# Patient Record
Sex: Male | Born: 1935 | Race: White | Hispanic: No | Marital: Married | Smoking: Former smoker
Health system: Southern US, Community
[De-identification: ages and names within clinical notes are randomized; demographics above are authoritative.]

## PROBLEM LIST (undated history)

## (undated) DIAGNOSIS — Z87898 Personal history of other specified conditions: Secondary | ICD-10-CM

## (undated) DIAGNOSIS — I6529 Occlusion and stenosis of unspecified carotid artery: Secondary | ICD-10-CM

## (undated) DIAGNOSIS — I714 Abdominal aortic aneurysm, without rupture, unspecified: Secondary | ICD-10-CM

## (undated) DIAGNOSIS — E785 Hyperlipidemia, unspecified: Secondary | ICD-10-CM

## (undated) DIAGNOSIS — Z87442 Personal history of urinary calculi: Secondary | ICD-10-CM

## (undated) DIAGNOSIS — E119 Type 2 diabetes mellitus without complications: Secondary | ICD-10-CM

## (undated) DIAGNOSIS — I2510 Atherosclerotic heart disease of native coronary artery without angina pectoris: Secondary | ICD-10-CM

## (undated) DIAGNOSIS — I10 Essential (primary) hypertension: Secondary | ICD-10-CM

## (undated) DIAGNOSIS — R251 Tremor, unspecified: Secondary | ICD-10-CM

## (undated) DIAGNOSIS — I739 Peripheral vascular disease, unspecified: Secondary | ICD-10-CM

## (undated) DIAGNOSIS — R011 Cardiac murmur, unspecified: Secondary | ICD-10-CM

## (undated) DIAGNOSIS — C4491 Basal cell carcinoma of skin, unspecified: Secondary | ICD-10-CM

## (undated) DIAGNOSIS — Z9289 Personal history of other medical treatment: Secondary | ICD-10-CM

## (undated) DIAGNOSIS — D649 Anemia, unspecified: Secondary | ICD-10-CM

## (undated) DIAGNOSIS — M069 Rheumatoid arthritis, unspecified: Secondary | ICD-10-CM

## (undated) DIAGNOSIS — R569 Unspecified convulsions: Secondary | ICD-10-CM

## (undated) DIAGNOSIS — K259 Gastric ulcer, unspecified as acute or chronic, without hemorrhage or perforation: Secondary | ICD-10-CM

## (undated) DIAGNOSIS — M1990 Unspecified osteoarthritis, unspecified site: Secondary | ICD-10-CM

## (undated) DIAGNOSIS — IMO0001 Reserved for inherently not codable concepts without codable children: Secondary | ICD-10-CM

## (undated) DIAGNOSIS — I38 Endocarditis, valve unspecified: Secondary | ICD-10-CM

## (undated) DIAGNOSIS — F1021 Alcohol dependence, in remission: Secondary | ICD-10-CM

## (undated) DIAGNOSIS — H269 Unspecified cataract: Secondary | ICD-10-CM

## (undated) HISTORY — PX: FEMORAL BYPASS: SHX50

## (undated) HISTORY — DX: Anemia, unspecified: D64.9

## (undated) HISTORY — DX: Peripheral vascular disease, unspecified: I73.9

## (undated) HISTORY — DX: Essential (primary) hypertension: I10

## (undated) HISTORY — DX: Type 2 diabetes mellitus without complications: E11.9

## (undated) HISTORY — PX: ABDOMINAL AORTIC ANEURYSM REPAIR: SUR1152

## (undated) HISTORY — DX: Abdominal aortic aneurysm, without rupture: I71.4

## (undated) HISTORY — DX: Alcohol dependence, in remission: F10.21

## (undated) HISTORY — DX: Occlusion and stenosis of unspecified carotid artery: I65.29

## (undated) HISTORY — DX: Basal cell carcinoma of skin, unspecified: C44.91

## (undated) HISTORY — DX: Rheumatoid arthritis, unspecified: M06.9

## (undated) HISTORY — DX: Cardiac murmur, unspecified: R01.1

## (undated) HISTORY — PX: COLONOSCOPY: SHX174

## (undated) HISTORY — PX: CYSTOSCOPY: SUR368

## (undated) HISTORY — DX: Abdominal aortic aneurysm, without rupture, unspecified: I71.40

## (undated) HISTORY — DX: Tremor, unspecified: R25.1

## (undated) HISTORY — DX: Endocarditis, valve unspecified: I38

## (undated) HISTORY — DX: Hyperlipidemia, unspecified: E78.5

## (undated) HISTORY — DX: Unspecified osteoarthritis, unspecified site: M19.90

## (undated) HISTORY — DX: Unspecified cataract: H26.9

## (undated) HISTORY — DX: Personal history of other specified conditions: Z87.898

## (undated) HISTORY — PX: CATARACT EXTRACTION, BILATERAL: SHX1313

---

## 1994-05-16 HISTORY — PX: HERNIA REPAIR: SHX51

## 2004-07-08 ENCOUNTER — Ambulatory Visit: Payer: Self-pay | Admitting: General Surgery

## 2005-03-23 ENCOUNTER — Ambulatory Visit: Payer: Self-pay | Admitting: Rheumatology

## 2006-06-21 ENCOUNTER — Inpatient Hospital Stay: Payer: Self-pay | Admitting: Internal Medicine

## 2006-06-21 ENCOUNTER — Other Ambulatory Visit: Payer: Self-pay

## 2006-10-06 ENCOUNTER — Ambulatory Visit: Payer: Self-pay | Admitting: Gastroenterology

## 2007-01-25 ENCOUNTER — Ambulatory Visit: Payer: Self-pay | Admitting: Rheumatology

## 2007-02-05 ENCOUNTER — Encounter: Payer: Self-pay | Admitting: Rheumatology

## 2007-02-14 ENCOUNTER — Encounter: Payer: Self-pay | Admitting: Rheumatology

## 2007-03-17 ENCOUNTER — Encounter: Payer: Self-pay | Admitting: Rheumatology

## 2007-08-07 ENCOUNTER — Ambulatory Visit: Payer: Self-pay | Admitting: Vascular Surgery

## 2007-08-14 ENCOUNTER — Ambulatory Visit: Payer: Self-pay | Admitting: Internal Medicine

## 2007-08-21 ENCOUNTER — Ambulatory Visit: Payer: Self-pay | Admitting: Vascular Surgery

## 2007-08-23 ENCOUNTER — Ambulatory Visit: Payer: Self-pay | Admitting: Vascular Surgery

## 2007-08-29 ENCOUNTER — Inpatient Hospital Stay: Payer: Self-pay | Admitting: Vascular Surgery

## 2007-08-29 HISTORY — PX: ABDOMINAL AORTIC ANEURYSM REPAIR: SUR1152

## 2007-11-06 ENCOUNTER — Other Ambulatory Visit: Payer: Self-pay

## 2007-11-06 ENCOUNTER — Emergency Department: Payer: Self-pay | Admitting: Internal Medicine

## 2008-08-21 ENCOUNTER — Ambulatory Visit: Payer: Self-pay | Admitting: Vascular Surgery

## 2009-02-10 ENCOUNTER — Ambulatory Visit: Payer: Self-pay | Admitting: Gastroenterology

## 2009-10-16 ENCOUNTER — Ambulatory Visit: Payer: Self-pay | Admitting: Vascular Surgery

## 2010-11-24 ENCOUNTER — Ambulatory Visit: Payer: Self-pay | Admitting: Family Medicine

## 2012-02-24 ENCOUNTER — Ambulatory Visit: Payer: Self-pay | Admitting: Vascular Surgery

## 2012-03-12 ENCOUNTER — Emergency Department: Payer: Self-pay | Admitting: Emergency Medicine

## 2012-03-12 LAB — COMPREHENSIVE METABOLIC PANEL
Alkaline Phosphatase: 78 U/L (ref 50–136)
Anion Gap: 10 (ref 7–16)
BUN: 16 mg/dL (ref 7–18)
Calcium, Total: 9 mg/dL (ref 8.5–10.1)
Chloride: 108 mmol/L — ABNORMAL HIGH (ref 98–107)
Co2: 26 mmol/L (ref 21–32)
EGFR (African American): 56 — ABNORMAL LOW
EGFR (Non-African Amer.): 48 — ABNORMAL LOW
Osmolality: 292 (ref 275–301)
Potassium: 4.3 mmol/L (ref 3.5–5.1)
SGPT (ALT): 11 U/L — ABNORMAL LOW (ref 12–78)
Sodium: 144 mmol/L (ref 136–145)
Total Protein: 7.6 g/dL (ref 6.4–8.2)

## 2012-03-12 LAB — URINALYSIS, COMPLETE
Bacteria: NONE SEEN
Bilirubin,UR: NEGATIVE
Glucose,UR: 150 mg/dL (ref 0–75)
Leukocyte Esterase: NEGATIVE
Nitrite: NEGATIVE
Ph: 7 (ref 4.5–8.0)
RBC,UR: 94 /HPF (ref 0–5)
Specific Gravity: 1.012 (ref 1.003–1.030)

## 2012-03-12 LAB — CBC
HCT: 37.6 % — ABNORMAL LOW (ref 40.0–52.0)
MCV: 83 fL (ref 80–100)
Platelet: 223 10*3/uL (ref 150–440)
RBC: 4.52 10*6/uL (ref 4.40–5.90)
WBC: 11.9 10*3/uL — ABNORMAL HIGH (ref 3.8–10.6)

## 2012-03-19 ENCOUNTER — Ambulatory Visit: Payer: Self-pay | Admitting: Urology

## 2012-03-19 DIAGNOSIS — I10 Essential (primary) hypertension: Secondary | ICD-10-CM

## 2012-03-21 ENCOUNTER — Ambulatory Visit: Payer: Self-pay | Admitting: Urology

## 2012-05-16 DIAGNOSIS — I6529 Occlusion and stenosis of unspecified carotid artery: Secondary | ICD-10-CM

## 2012-05-16 HISTORY — DX: Occlusion and stenosis of unspecified carotid artery: I65.29

## 2012-05-16 HISTORY — PX: CAROTID ENDARTERECTOMY: SUR193

## 2012-08-21 ENCOUNTER — Ambulatory Visit: Payer: Self-pay | Admitting: Gastroenterology

## 2012-09-11 ENCOUNTER — Ambulatory Visit: Payer: Self-pay | Admitting: Internal Medicine

## 2012-09-20 ENCOUNTER — Ambulatory Visit: Payer: Self-pay | Admitting: Vascular Surgery

## 2012-09-20 LAB — CBC
HCT: 34 % — ABNORMAL LOW (ref 40.0–52.0)
HGB: 11.1 g/dL — ABNORMAL LOW (ref 13.0–18.0)
MCHC: 32.6 g/dL (ref 32.0–36.0)
MCV: 78 fL — ABNORMAL LOW (ref 80–100)
Platelet: 238 10*3/uL (ref 150–440)
WBC: 9.1 10*3/uL (ref 3.8–10.6)

## 2012-09-20 LAB — BASIC METABOLIC PANEL
Anion Gap: 6 — ABNORMAL LOW (ref 7–16)
BUN: 22 mg/dL — ABNORMAL HIGH (ref 7–18)
Calcium, Total: 8.7 mg/dL (ref 8.5–10.1)
Chloride: 107 mmol/L (ref 98–107)
Co2: 27 mmol/L (ref 21–32)
Glucose: 81 mg/dL (ref 65–99)
Osmolality: 282 (ref 275–301)

## 2012-09-26 ENCOUNTER — Inpatient Hospital Stay: Payer: Self-pay | Admitting: Vascular Surgery

## 2012-09-27 LAB — CBC WITH DIFFERENTIAL/PLATELET
Basophil #: 0 10*3/uL (ref 0.0–0.1)
Eosinophil #: 0 10*3/uL (ref 0.0–0.7)
Eosinophil %: 0 %
Lymphocyte #: 1.7 10*3/uL (ref 1.0–3.6)
Lymphocyte %: 14.8 %
MCHC: 31.8 g/dL — ABNORMAL LOW (ref 32.0–36.0)
MCV: 79 fL — ABNORMAL LOW (ref 80–100)
Neutrophil %: 81.5 %
WBC: 11.2 10*3/uL — ABNORMAL HIGH (ref 3.8–10.6)

## 2012-09-27 LAB — PROTIME-INR: INR: 1.1

## 2012-09-27 LAB — BASIC METABOLIC PANEL
BUN: 18 mg/dL (ref 7–18)
Calcium, Total: 8.2 mg/dL — ABNORMAL LOW (ref 8.5–10.1)
EGFR (African American): >60
EGFR (Non-African Amer.): >60
Osmolality: 287 (ref 275–301)
Potassium: 4.4 mmol/L (ref 3.5–5.1)

## 2013-04-01 ENCOUNTER — Ambulatory Visit: Payer: Self-pay | Admitting: Ophthalmology

## 2013-04-01 DIAGNOSIS — I10 Essential (primary) hypertension: Secondary | ICD-10-CM

## 2013-04-01 LAB — CBC WITH DIFFERENTIAL/PLATELET
Basophil #: 0.1 10*3/uL (ref 0.0–0.1)
Eosinophil #: 0.2 10*3/uL (ref 0.0–0.7)
Eosinophil %: 2.1 %
HCT: 33.4 % — ABNORMAL LOW (ref 40.0–52.0)
HGB: 10.7 g/dL — ABNORMAL LOW (ref 13.0–18.0)
Lymphocyte #: 2.4 10*3/uL (ref 1.0–3.6)
Lymphocyte %: 26.9 %
MCH: 25 pg — ABNORMAL LOW (ref 26.0–34.0)
MCHC: 32 g/dL (ref 32.0–36.0)
Monocyte #: 0.8 x10 3/mm (ref 0.2–1.0)
Platelet: 221 10*3/uL (ref 150–440)
RBC: 4.27 10*6/uL — ABNORMAL LOW (ref 4.40–5.90)
RDW: 19.3 % — ABNORMAL HIGH (ref 11.5–14.5)
WBC: 8.9 10*3/uL (ref 3.8–10.6)

## 2013-04-08 ENCOUNTER — Ambulatory Visit: Payer: Self-pay | Admitting: Ophthalmology

## 2013-05-22 ENCOUNTER — Ambulatory Visit: Payer: Self-pay | Admitting: Vascular Surgery

## 2013-06-03 ENCOUNTER — Ambulatory Visit: Payer: Self-pay | Admitting: Vascular Surgery

## 2013-06-03 LAB — BASIC METABOLIC PANEL
Anion Gap: 3 — ABNORMAL LOW (ref 7–16)
BUN: 18 mg/dL (ref 7–18)
CREATININE: 1.32 mg/dL — AB (ref 0.60–1.30)
Calcium, Total: 8.8 mg/dL (ref 8.5–10.1)
Chloride: 105 mmol/L (ref 98–107)
Co2: 28 mmol/L (ref 21–32)
EGFR (Non-African Amer.): 52 — ABNORMAL LOW
GLUCOSE: 96 mg/dL (ref 65–99)
Osmolality: 274 (ref 275–301)
Potassium: 3.8 mmol/L (ref 3.5–5.1)
Sodium: 136 mmol/L (ref 136–145)

## 2013-10-30 ENCOUNTER — Encounter: Payer: Self-pay | Admitting: Cardiovascular Disease

## 2013-10-30 ENCOUNTER — Ambulatory Visit (INDEPENDENT_AMBULATORY_CARE_PROVIDER_SITE_OTHER): Payer: Medicare Other | Admitting: Cardiovascular Disease

## 2013-10-30 VITALS — BP 152/80 | HR 75 | Ht 68.0 in | Wt 177.5 lb

## 2013-10-30 DIAGNOSIS — E1151 Type 2 diabetes mellitus with diabetic peripheral angiopathy without gangrene: Secondary | ICD-10-CM

## 2013-10-30 DIAGNOSIS — E1159 Type 2 diabetes mellitus with other circulatory complications: Secondary | ICD-10-CM

## 2013-10-30 DIAGNOSIS — I10 Essential (primary) hypertension: Secondary | ICD-10-CM

## 2013-10-30 DIAGNOSIS — Z9889 Other specified postprocedural states: Secondary | ICD-10-CM

## 2013-10-30 DIAGNOSIS — I714 Abdominal aortic aneurysm, without rupture, unspecified: Secondary | ICD-10-CM

## 2013-10-30 DIAGNOSIS — Z87891 Personal history of nicotine dependence: Secondary | ICD-10-CM

## 2013-10-30 DIAGNOSIS — I6529 Occlusion and stenosis of unspecified carotid artery: Secondary | ICD-10-CM

## 2013-10-30 DIAGNOSIS — I359 Nonrheumatic aortic valve disorder, unspecified: Secondary | ICD-10-CM

## 2013-10-30 DIAGNOSIS — E785 Hyperlipidemia, unspecified: Secondary | ICD-10-CM

## 2013-10-30 DIAGNOSIS — I798 Other disorders of arteries, arterioles and capillaries in diseases classified elsewhere: Secondary | ICD-10-CM

## 2013-10-30 DIAGNOSIS — I350 Nonrheumatic aortic (valve) stenosis: Secondary | ICD-10-CM

## 2013-10-30 DIAGNOSIS — Z8679 Personal history of other diseases of the circulatory system: Secondary | ICD-10-CM

## 2013-10-30 NOTE — Assessment & Plan Note (Signed)
History of carotid endarterectomy on the right, moderate disease on the left. Continue aggressive cholesterol management   

## 2013-10-30 NOTE — Assessment & Plan Note (Signed)
Cholesterol is at goal on the current lipid regimen. No changes to the medications were made.  

## 2013-10-30 NOTE — Assessment & Plan Note (Signed)
Recent hemoglobin A1c's showing well-controlled sugars. 

## 2013-10-30 NOTE — Progress Notes (Signed)
Patient ID: Chase Bell, male    DOB: 12-29-1934, 79 y.o.   MRN: [id number]  HPI Comments: [name] is a pleasant 79 year old gentleman with significant peripheral vascular disease, abdominal aortic aneurysm with endograft placement, left femoral bypass, carotid endarterectomy on the right, moderate disease of the left carotid, diabetes2 with complications, history of GI bleed May 2008 with positive H. pylori, moderate aortic valve stenosis, hyperlipidemia who presents to establish care for his valve disease. Notes indicate renal artery stenosis, osteoarthritis, rheumatoid arthritis, on methotrexate. Prior history of alcohol, seizures, essential tremor. Baseline anemia Prior smoking history for 50 years, quit in 2006  He was previously seen by [facility] cardiology but reported that he was not happy with his care. Studies show recent echocardiogram 09/11/2012 showing normal ejection fraction, mitral valve regurgitation detailed as moderate to severe, also moderate to severe aortic valve stenosis, moderate TR. Review of the aortic valve parameters suggest moderate aortic valve stenosis. Maximum velocity 3.6 m/s, peak gradient 52 mm mercury, mean gradient 31 mm of mercury  Stress test 09/11/2012 shows no ischemia, normal ejection fraction. He did a Bruce protocol and achieved peak heart rate 102 beats per minute. This is actually a suboptimal stress test as target heart rate was not achieved. This was not mentioned in the final conclusions  He recently underwent coil embolization for endoleak January 2015. This was done after sac growth was noted, type II endoleak identified. Surgeon was Dr. Lucky Cowboy Notes indicate recent hemoglobin A1c 6.0. Blood pressure has been well-controlled on his current medications. If he has cholesterol less than 150. Most recent lab work shows total cholesterol 135, LDL 80, HDL 40  He denies having any significant chest pain with exertion. He does have mild shortness  of breath which has been getting slightly worse over the past several years In general he plays golf with no significant symptoms  EKG shows normal sinus rhythm with rate 75 beats per minute, APCs in a bigeminal pattern, LVH with ST and T wave abnormality in V5, V6, 1 and aVL     Outpatient Encounter Prescriptions as of 10/30/2013  Medication Sig  . aspirin 81 MG tablet Take 81 mg by mouth daily.  . carbidopa-levodopa (SINEMET IR) 25-100 MG per tablet Take 1 tablet by mouth daily.   . cilostazol (PLETAL) 100 MG tablet Take 100 mg by mouth daily.   . folic acid (FOLVITE) 1 MG tablet Take 1 mg by mouth daily.  . hydroxychloroquine (PLAQUENIL) 200 MG tablet Take by mouth daily.  Marland Kitchen lisinopril (PRINIVIL,ZESTRIL) 20 MG tablet Take 20 mg by mouth daily.  . metFORMIN (GLUCOPHAGE) 850 MG tablet Take 1/2 tablet  . methotrexate (RHEUMATREX) 2.5 MG tablet Take 6 tablets weekly.  Marland Kitchen omeprazole (PRILOSEC) 20 MG capsule Take 20 mg by mouth daily.   Marland Kitchen oxyCODONE-acetaminophen (PERCOCET/ROXICET) 5-325 MG per tablet Take by mouth every 4 (four) hours as needed for severe pain.  . rosuvastatin (CRESTOR) 20 MG tablet Take 20 mg by mouth daily.  . [DISCONTINUED] clopidogrel (PLAVIX) 75 MG tablet Take 75 mg by mouth daily with breakfast.     Review of Systems  Constitutional: Negative.   HENT: Negative.   Eyes: Negative.   Respiratory: Positive for shortness of breath.   Cardiovascular: Negative.   Gastrointestinal: Negative.   Endocrine: Negative.   Musculoskeletal: Positive for arthralgias.  Skin: Negative.   Allergic/Immunologic: Negative.   Neurological: Negative.   Hematological: Negative.   Psychiatric/Behavioral: Negative.   All other systems reviewed and are  negative.   BP 152/80  Pulse 75  Ht 5\' 8"  (1.727 m)  Wt 177 lb 8 oz (80.513 kg)  BMI 26.99 kg/m2  Physical Exam  Nursing note and vitals reviewed. Constitutional: He is oriented to person, place, and time. He appears  well-developed and well-nourished.  HENT:  Head: Normocephalic.  Nose: Nose normal.  Mouth/Throat: Oropharynx is clear and moist.  Eyes: Conjunctivae are normal. Pupils are equal, round, and reactive to light.  Neck: Normal range of motion. Neck supple. No JVD present.  Cardiovascular: Normal rate, regular rhythm, S1 normal, S2 normal, normal heart sounds and intact distal pulses.  Exam reveals no gallop and no friction rub.   No murmur heard. Pulmonary/Chest: Effort normal and breath sounds normal. No respiratory distress. He has no wheezes. He has no rales. He exhibits no tenderness.  Abdominal: Soft. Bowel sounds are normal. He exhibits no distension. There is no tenderness.  Musculoskeletal: Normal range of motion. He exhibits no edema and no tenderness.  Lymphadenopathy:    He has no cervical adenopathy.  Neurological: He is alert and oriented to person, place, and time. Coordination normal.  Skin: Skin is warm and dry. No rash noted. No erythema.  Psychiatric: He has a normal mood and affect. His behavior is normal. Judgment and thought content normal.      Assessment and Plan

## 2013-10-30 NOTE — Assessment & Plan Note (Signed)
Prior significant smoking history likely a major contributor to his peripheral vascular disease. Stopped smoking many years ago

## 2013-10-30 NOTE — Assessment & Plan Note (Signed)
Prior endograft, recent coiling or endoleak in January 2015. Monitor by Dr. dew

## 2013-10-30 NOTE — Assessment & Plan Note (Signed)
Recent echocardiogram through the hospital suggests moderate aortic valve stenosis. He is asymptomatic at this time. We have recommended he see him on a periodic basis in the office to evaluate his symptoms, periodic echocardiography possibly once a year, if not every other year. He may benefit from a low-dose beta blocker in the future

## 2013-10-30 NOTE — Patient Instructions (Signed)
You are doing well. No medication changes were made.  Please call us if you have new issues that need to be addressed before your next appt.  Your physician wants you to follow-up in: 6 months.  You will receive a reminder letter in the mail two months in advance. If you don't receive a letter, please call our office to schedule the follow-up appointment.   

## 2013-10-30 NOTE — Assessment & Plan Note (Signed)
Blood pressure is well controlled on today's visit. No changes made to the medications. 

## 2014-04-29 ENCOUNTER — Ambulatory Visit: Payer: Medicare Other | Admitting: Cardiovascular Disease

## 2014-05-19 ENCOUNTER — Ambulatory Visit (INDEPENDENT_AMBULATORY_CARE_PROVIDER_SITE_OTHER): Payer: Medicare HMO | Admitting: Cardiovascular Disease

## 2014-05-19 ENCOUNTER — Telehealth: Payer: Self-pay

## 2014-05-19 ENCOUNTER — Encounter: Payer: Self-pay | Admitting: Cardiovascular Disease

## 2014-05-19 VITALS — BP 142/72 | HR 80 | Ht 69.0 in | Wt 177.2 lb

## 2014-05-19 DIAGNOSIS — I714 Abdominal aortic aneurysm, without rupture, unspecified: Secondary | ICD-10-CM

## 2014-05-19 DIAGNOSIS — R0789 Other chest pain: Secondary | ICD-10-CM

## 2014-05-19 DIAGNOSIS — R55 Syncope and collapse: Secondary | ICD-10-CM

## 2014-05-19 DIAGNOSIS — E1151 Type 2 diabetes mellitus with diabetic peripheral angiopathy without gangrene: Secondary | ICD-10-CM

## 2014-05-19 DIAGNOSIS — E1159 Type 2 diabetes mellitus with other circulatory complications: Secondary | ICD-10-CM

## 2014-05-19 DIAGNOSIS — I10 Essential (primary) hypertension: Secondary | ICD-10-CM

## 2014-05-19 DIAGNOSIS — E785 Hyperlipidemia, unspecified: Secondary | ICD-10-CM

## 2014-05-19 DIAGNOSIS — I6523 Occlusion and stenosis of bilateral carotid arteries: Secondary | ICD-10-CM

## 2014-05-19 DIAGNOSIS — I350 Nonrheumatic aortic (valve) stenosis: Secondary | ICD-10-CM

## 2014-05-19 NOTE — Telephone Encounter (Signed)
Pt wife called, states that on Friday 05/16/2014, pt had an episode where he had sharp pain across his shoulders, and sweating profusely, states he had on a flanned shirt and it was "sopping wet", also states his face and hands turned "ashy", no color, his lips turned blue. States pt did not do anything yesterday but sit in his recliner. Pt has an appt on 05/21/2013. Please advise.

## 2014-05-19 NOTE — Assessment & Plan Note (Signed)
Status post endograft with recent coiling by Dr. Lucky Cowboy for endograft leak

## 2014-05-19 NOTE — Telephone Encounter (Signed)
Spoke w/ pt's wife.  She reports that pt is still feeling weak and sitting in his recliner.  Offered appt to see Dr. Rockey Situ this afternoon at 3:30, but discussed w/ her the need to call 911 or proceed to nearest ED is sx return or become emergent.  She states that he refused to go to ED on Friday, but next time she will not ask, she will call 911.

## 2014-05-19 NOTE — Patient Instructions (Signed)
We have ordered an echocardiogram to look at the aortic valve stenosis  Please call us if you have new issues that need to be addressed before your next appt.  Your physician wants you to follow-up in: 3 months.  You will receive a reminder letter in the mail two months in advance. If you don't receive a letter, please call our office to schedule the follow-up appointment.

## 2014-05-19 NOTE — Assessment & Plan Note (Signed)
Prominent murmur on exam. Known moderate to severe aortic valve stenosis though moderate by parameters in 2014. Repeat echocardiogram has been ordered

## 2014-05-19 NOTE — Progress Notes (Signed)
Patient ID: Chase Bell, male    DOB: [birth year], 80 y.o.   MRN: [id number]  HPI Comments: Chase Bell is a pleasant 80 year old gentleman with significant peripheral vascular disease, abdominal aortic aneurysm with endograft placement, left femoral bypass, carotid endarterectomy on the right, moderate disease of the left carotid, diabetes2 with complications, history of GI bleed May 2008 with positive H. pylori, moderate aortic valve stenosis, hyperlipidemia who presents to establish care for his valve disease. Notes indicate renal artery stenosis, osteoarthritis, rheumatoid arthritis, on methotrexate. Prior history of alcohol, seizures, essential tremor. Baseline anemia Prior smoking history for 50 years, quit in 2006 He presents today for recent episode of near syncope    he reports that on January 1 he was in the kitchen when he developed lightheadedness. He walked to the bathroom with worsening symptoms, felt sweaty, had pain across his shoulder blades. Wife reports he was very pale, he had to lay down on the ground, had incontinence. On the ground his blood pressure was 70 systolic, heart rates in the 50-60 range Glucose in the 140s After a period of time, he insisted on getting up and with the help of the daughter and wife, he moved to the recliner in a supine position He had cold washcloths placed on his face and after some time, felt back to his baseline Wife states that he was relatively fatigued for several days, now getting better. Does not appear pale anymore Etiology is not clear. He denied any chest pain or shortness of breath, no tachycardia. Denied any significant GI pain or loose bowel movements or constipation prior to the episode He did have a cut in his gum underneath his denture around the time of the incident and had to pull out the plate He was told that his hemoglobin is 10. He has been on and off the iron pills. Denies any bright red blood per rectum or very dark  stools  EKG on today's visit shows normal sinus rhythm with rate 80 bpm, rare PVC, nonspecific ST abnormality anterolateral leads, unchanged from prior EKG   Other past medical history  Studies show recent echocardiogram 09/11/2012 showing normal ejection fraction, mitral valve regurgitation detailed as moderate to severe, also moderate to severe aortic valve stenosis, moderate TR. Review of the aortic valve parameters suggest moderate aortic valve stenosis. Maximum velocity 3.6 m/s, peak gradient 52 mm mercury, mean gradient 31 mm of mercury  Stress test 09/11/2012 shows no ischemia, normal ejection fraction. He did a Bruce protocol and achieved peak heart rate 102 beats per minute. This is actually a suboptimal stress test as target heart rate was not achieved. This was not mentioned in the final conclusions   history ofoil embolization for endoleak January 2015. This was done after sac growth was noted, type II endoleak identified. Surgeon was Dr. Lucky Cowboy Notes indicate recent hemoglobin A1c 6.0. Blood pressure has been well-controlled on his current medications. cholesterol less than 150. Most recent lab work shows total cholesterol 135, LDL 80, HDL 40       Allergies  Allergen Reactions  . Simvastatin     Myalgia     Outpatient Encounter Prescriptions as of 05/19/2014  Medication Sig  . aspirin 81 MG tablet Take 81 mg by mouth daily.  . carbidopa-levodopa (SINEMET IR) 25-100 MG per tablet Take 1 tablet by mouth daily.   . cilostazol (PLETAL) 100 MG tablet Take 100 mg by mouth daily.   . ferrous fumarate (HEMOCYTE - 106 MG FE) 325 (  106 FE) MG TABS tablet Take 1 tablet by mouth every other day.  . folic acid (FOLVITE) 1 MG tablet Take 1 mg by mouth daily.  . hydroxychloroquine (PLAQUENIL) 200 MG tablet Take by mouth daily.  Marland Kitchen lisinopril (PRINIVIL,ZESTRIL) 20 MG tablet Take 20 mg by mouth daily.  . methotrexate (RHEUMATREX) 2.5 MG tablet Take 6 tablets weekly.  Marland Kitchen omeprazole (PRILOSEC)  20 MG capsule Take 20 mg by mouth daily.   Marland Kitchen oxyCODONE-acetaminophen (PERCOCET/ROXICET) 5-325 MG per tablet Take by mouth every 4 (four) hours as needed for severe pain.  . rosuvastatin (CRESTOR) 20 MG tablet Take 20 mg by mouth daily.  . [DISCONTINUED] metFORMIN (GLUCOPHAGE) 850 MG tablet Take 1/2 tablet    Past Medical History  Diagnosis Date  . PVD (peripheral vascular disease)     with abdominal aortic aneurysm and claudication, s/p repair.  Marland Kitchen Hyperlipidemia   . Hypertension   . Valvular heart disease     moderate to severe aortic stenosis  . AAA (abdominal aortic aneurysm)     s/p repair/stent placement  . Heart murmur   . Carotid artery occlusion 2014  . Renal artery stenosis   . Diabetes mellitus without complication   . Basal cell carcinoma   . Kidney stones   . Bilateral cataracts   . Nephrolithiasis   . Tremor   . History of alcoholism   . History of seizures   . Anemia   . Osteoarthritis   . Rheumatoid arthritis   . Anemia     Past Surgical History  Procedure Laterality Date  . Femoral bypass      left leg  . Cataract extraction, bilateral    . Hernia repair  1996  . Abdominal aortic aneurysm repair  08/29/2007    stent graft repair  . Cystoscopy    . Carotid endarterectomy  2014    right     Social History  reports that he quit smoking about 9 years ago. His smoking use included Cigarettes. He has a 50 pack-year smoking history. His smokeless tobacco use includes Chew. He reports that he does not drink alcohol or use illicit drugs.  Family History family history includes Heart disease in his brother.   Review of Systems  Constitutional: Negative.   Respiratory: Negative.   Cardiovascular: Negative.   Gastrointestinal: Negative.   Musculoskeletal: Positive for arthralgias.  Neurological: Positive for syncope.  Hematological: Negative.   Psychiatric/Behavioral: Negative.   All other systems reviewed and are negative.   BP 142/72 mmHg  Pulse  80  Ht 5\' 9"  (1.753 m)  Wt 177 lb 4 oz (80.4 kg)  BMI 26.16 kg/m2  Physical Exam  Constitutional: He is oriented to person, place, and time. He appears well-developed and well-nourished.  HENT:  Head: Normocephalic.  Nose: Nose normal.  Mouth/Throat: Oropharynx is clear and moist.  Eyes: Conjunctivae are normal. Pupils are equal, round, and reactive to light.  Neck: Normal range of motion. Neck supple. No JVD present.  Cardiovascular: Normal rate, regular rhythm, S1 normal, S2 normal, normal heart sounds and intact distal pulses.  Exam reveals no gallop and no friction rub.   No murmur heard. Pulmonary/Chest: Effort normal and breath sounds normal. No respiratory distress. He has no wheezes. He has no rales. He exhibits no tenderness.  Abdominal: Soft. Bowel sounds are normal. He exhibits no distension. There is no tenderness.  Musculoskeletal: Normal range of motion. He exhibits no edema or tenderness.  Lymphadenopathy:    He has no  cervical adenopathy.  Neurological: He is alert and oriented to person, place, and time. Coordination normal.  Skin: Skin is warm and dry. No rash noted. No erythema.  Psychiatric: He has a normal mood and affect. His behavior is normal. Judgment and thought content normal.      Assessment and Plan   Nursing note and vitals reviewed.

## 2014-05-19 NOTE — Assessment & Plan Note (Signed)
History of carotid endarterectomy on the right, moderate disease on the left. Continue aggressive cholesterol management

## 2014-05-19 NOTE — Assessment & Plan Note (Signed)
Recent hemoglobin A1c's showing well-controlled sugars.

## 2014-05-19 NOTE — Assessment & Plan Note (Addendum)
Long discussion with him today concerning his recent episode on general first 2016. Etiology is not clear or in for vasovagal syncope. Unclear what his trigger was though he does report severe mouth pain from his denture. No prior episodes and no further episodes since that time. Echocardiogram has been ordered. If he has recurrent symptoms, 30 day monitor could be ordered as well as stress testing

## 2014-05-19 NOTE — Assessment & Plan Note (Signed)
Cholesterol is at goal on the current lipid regimen. No changes to the medications were made.  

## 2014-05-19 NOTE — Assessment & Plan Note (Signed)
Blood pressure is well controlled on today's visit. No changes made to the medications. 

## 2014-05-21 ENCOUNTER — Ambulatory Visit: Payer: Medicare Other | Admitting: Cardiovascular Disease

## 2014-05-23 ENCOUNTER — Other Ambulatory Visit (INDEPENDENT_AMBULATORY_CARE_PROVIDER_SITE_OTHER): Payer: Medicare HMO

## 2014-05-23 DIAGNOSIS — I350 Nonrheumatic aortic (valve) stenosis: Secondary | ICD-10-CM

## 2014-05-23 DIAGNOSIS — R0789 Other chest pain: Secondary | ICD-10-CM

## 2014-05-23 DIAGNOSIS — R55 Syncope and collapse: Secondary | ICD-10-CM

## 2014-05-28 ENCOUNTER — Ambulatory Visit (INDEPENDENT_AMBULATORY_CARE_PROVIDER_SITE_OTHER): Payer: Medicare HMO | Admitting: Cardiovascular Disease

## 2014-05-28 ENCOUNTER — Encounter: Payer: Self-pay | Admitting: Cardiovascular Disease

## 2014-05-28 VITALS — BP 118/78 | HR 80 | Ht 69.0 in | Wt 176.5 lb

## 2014-05-28 DIAGNOSIS — I6523 Occlusion and stenosis of bilateral carotid arteries: Secondary | ICD-10-CM

## 2014-05-28 DIAGNOSIS — R55 Syncope and collapse: Secondary | ICD-10-CM

## 2014-05-28 DIAGNOSIS — E785 Hyperlipidemia, unspecified: Secondary | ICD-10-CM

## 2014-05-28 DIAGNOSIS — E1159 Type 2 diabetes mellitus with other circulatory complications: Secondary | ICD-10-CM

## 2014-05-28 DIAGNOSIS — R5383 Other fatigue: Secondary | ICD-10-CM

## 2014-05-28 DIAGNOSIS — Z8679 Personal history of other diseases of the circulatory system: Secondary | ICD-10-CM

## 2014-05-28 DIAGNOSIS — I350 Nonrheumatic aortic (valve) stenosis: Secondary | ICD-10-CM

## 2014-05-28 DIAGNOSIS — Z9889 Other specified postprocedural states: Secondary | ICD-10-CM

## 2014-05-28 DIAGNOSIS — E1151 Type 2 diabetes mellitus with diabetic peripheral angiopathy without gangrene: Secondary | ICD-10-CM

## 2014-05-28 DIAGNOSIS — I159 Secondary hypertension, unspecified: Secondary | ICD-10-CM

## 2014-05-28 NOTE — Progress Notes (Signed)
Patient ID: Chase Bell, male    DOB: May 14, 1935, 80 y.o.   MRN: [id number]  HPI Comments: [name] is a pleasant 80 year old gentleman with significant peripheral vascular disease, abdominal aortic aneurysm with endograft placement, left femoral bypass, carotid endarterectomy on the right, moderate disease of the left carotid, diabetes2 with complications, history of GI bleed May 2008 with positive H. pylori, moderate aortic valve stenosis, hyperlipidemia,  Notes indicate renal artery stenosis, osteoarthritis, rheumatoid arthritis, on methotrexate. Prior history of alcohol, seizures, essential tremor. Baseline anemia Prior smoking history for 50 years, quit in 2006 Episode of near syncope in the summer of 2015 and again 05/16/2014 His vascular disease is followed by Dr. Lucky Cowboy. He has decreased pulse in his right upper extremity He presents today for follow-up of his echocardiogram showing severe aortic valve stenosis  In follow-up today, he denies any more lightheaded or dizzy spells. He does have fatigue. Unable to walk like he used to secondary to feeling tired, some shortness of breath. He does report over the summer 2015 he had an episode where he was working outside, felt acutely hot, dizzy, almost passed out. Symptoms resolved once he got back into the house and rested. Symptoms were similar to 05/16/2014 when he developed lightheadedness. He walked to the bathroom with worsening symptoms, felt sweaty, had pain across his shoulder blades. Wife reports he was very pale, he had to lay down on the ground, had incontinence. On the ground his blood pressure was 70 systolic, heart rates in the 50-60 range, Glucose in the 140s  Recent echocardiogram showed progression of his aortic valve stenosis, now severe, peak gradient 79 mmHg, mean gradient 45. There is been moderate progression since last echocardiogram April 2014  EKG on today's visit shows normal sinus rhythm with rate 78 bpm,   nonspecific ST abnormality anterolateral leads, unchanged from prior EKG , PVCs  Other past medical history  Studies show recent echocardiogram 09/11/2012 showing normal ejection fraction, mitral valve regurgitation detailed as moderate to severe, also moderate to severe aortic valve stenosis, moderate TR. Review of the aortic valve parameters suggest moderate aortic valve stenosis. Maximum velocity 3.6 m/s, peak gradient 52 mm mercury, mean gradient 31 mm of mercury  Stress test 09/11/2012 shows no ischemia, normal ejection fraction. He did a Bruce protocol and achieved peak heart rate 102 beats per minute. This is actually a suboptimal stress test as target heart rate was not achieved. This was not mentioned in the final conclusions   history ofoil embolization for endoleak January 2015. This was done after sac growth was noted, type II endoleak identified. Surgeon was Dr. Lucky Cowboy Notes indicate recent hemoglobin A1c 6.0. Blood pressure has been well-controlled on his current medications. cholesterol less than 150. Most recent lab work shows total cholesterol 135, LDL 80, HDL 40       Allergies  Allergen Reactions  . Simvastatin     Myalgia     Outpatient Encounter Prescriptions as of 05/28/2014  Medication Sig  . aspirin 81 MG tablet Take 81 mg by mouth daily.  . carbidopa-levodopa (SINEMET IR) 25-100 MG per tablet Take 1 tablet by mouth daily.   . cilostazol (PLETAL) 100 MG tablet Take 100 mg by mouth daily.   Marland Kitchen esomeprazole (NEXIUM) 20 MG capsule TAKE ONE CAPSULE BY MOUTH TWICE DAILY  . ferrous fumarate (HEMOCYTE - 106 MG FE) 325 (106 FE) MG TABS tablet Take 1 tablet by mouth every other day.  . folic acid (FOLVITE) 1 MG tablet  Take 1 mg by mouth daily.  . hydroxychloroquine (PLAQUENIL) 200 MG tablet Take by mouth daily.  Marland Kitchen lisinopril (PRINIVIL,ZESTRIL) 20 MG tablet Take 20 mg by mouth daily.  . methotrexate (RHEUMATREX) 2.5 MG tablet Take 6 tablets weekly.  Marland Kitchen  oxyCODONE-acetaminophen (PERCOCET/ROXICET) 5-325 MG per tablet Take by mouth every 4 (four) hours as needed for severe pain.  . rosuvastatin (CRESTOR) 20 MG tablet Take 20 mg by mouth daily.  . [DISCONTINUED] omeprazole (PRILOSEC) 20 MG capsule Take 20 mg by mouth daily.     Past Medical History  Diagnosis Date  . PVD (peripheral vascular disease)     with abdominal aortic aneurysm and claudication, s/p repair.  Marland Kitchen Hyperlipidemia   . Hypertension   . Valvular heart disease     moderate to severe aortic stenosis  . AAA (abdominal aortic aneurysm)     s/p repair/stent placement  . Heart murmur   . Carotid artery occlusion 2014  . Renal artery stenosis   . Diabetes mellitus without complication   . Basal cell carcinoma   . Kidney stones   . Bilateral cataracts   . Nephrolithiasis   . Tremor   . History of alcoholism   . History of seizures   . Anemia   . Osteoarthritis   . Rheumatoid arthritis   . Anemia     Past Surgical History  Procedure Laterality Date  . Femoral bypass      left leg  . Cataract extraction, bilateral    . Hernia repair  1996  . Abdominal aortic aneurysm repair  08/29/2007    stent graft repair  . Cystoscopy    . Carotid endarterectomy  2014    right     Social History  reports that he quit smoking about 9 years ago. His smoking use included Cigarettes. He has a 50 pack-year smoking history. His smokeless tobacco use includes Chew. He reports that he does not drink alcohol or use illicit drugs.  Family History family history includes Heart disease in his brother.  Review of Systems  Constitutional: Positive for fatigue.  Respiratory: Negative.   Cardiovascular: Negative.   Gastrointestinal: Negative.   Musculoskeletal: Positive for arthralgias.  Neurological: Negative.   Hematological: Negative.   Psychiatric/Behavioral: Negative.   All other systems reviewed and are negative.   BP 118/78 mmHg  Pulse 80  Ht 5\' 9"  (1.753 m)  Wt 176 lb 8  oz (80.06 kg)  BMI 26.05 kg/m2  Physical Exam  Constitutional: He is oriented to person, place, and time. He appears well-developed and well-nourished.  HENT:  Head: Normocephalic.  Nose: Nose normal.  Mouth/Throat: Oropharynx is clear and moist.  Eyes: Conjunctivae are normal. Pupils are equal, round, and reactive to light.  Neck: Normal range of motion. Neck supple. No JVD present.  Cardiovascular: Normal rate, regular rhythm, S1 normal, S2 normal, normal heart sounds and intact distal pulses.  Exam reveals no gallop and no friction rub.   No murmur heard. Pulmonary/Chest: Effort normal and breath sounds normal. No respiratory distress. He has no wheezes. He has no rales. He exhibits no tenderness.  Abdominal: Soft. Bowel sounds are normal. He exhibits no distension. There is no tenderness.  Musculoskeletal: Normal range of motion. He exhibits no edema or tenderness.  Lymphadenopathy:    He has no cervical adenopathy.  Neurological: He is alert and oriented to person, place, and time. Coordination normal.  Skin: Skin is warm and dry. No rash noted. No erythema.  Psychiatric: He has  a normal mood and affect. His behavior is normal. Judgment and thought content normal.      Assessment and Plan   Nursing note and vitals reviewed.

## 2014-05-28 NOTE — Assessment & Plan Note (Addendum)
Details of the echocardiogram discussed with them. Workup and possible options for him were also discussed in detail. We have recommended endoscopy/TEE to further evaluate his aortic valve. Suggested he call us when he is ready to schedule this. He and his wife did not want to schedule this at today's visit. He would also need a cardiac catheterization prior to any valve surgery. The dependent types of valve surgery were discussed with him. Uncertain if he would be a candidate for TAVR given his previous aortoiliac endograft

## 2014-05-28 NOTE — Assessment & Plan Note (Signed)
Status post endograft with recent coiling by Dr. Lucky Cowboy for endograft leak

## 2014-05-28 NOTE — Assessment & Plan Note (Signed)
Recent episode of near-syncope concerning for severe aortic valve stenosis. Unable to exclude arrhythmia. Recommended if he has additional episodes that he call our office and we would proceed urgently with the TEE and a 30 day Holter monitor.

## 2014-05-28 NOTE — Assessment & Plan Note (Signed)
History of carotid endarterectomy on the right, moderate disease on the left. Continue aggressive cholesterol management He also has decreased pulsation of the right upper extremity

## 2014-05-28 NOTE — Assessment & Plan Note (Signed)
We have encouraged continued exercise, careful diet management in an effort to lose weight. 

## 2014-05-28 NOTE — Patient Instructions (Signed)
You are doing well. No medication changes were made.  Please call when you are ready for a endoscopy  Please call us if you have new issues that need to be addressed before your next appt.  Your physician wants you to follow-up in: 6 months.  You will receive a reminder letter in the mail two months in advance. If you don't receive a letter, please call our office to schedule the follow-up appointment.

## 2014-05-28 NOTE — Assessment & Plan Note (Signed)
Cholesterol is at goal on the current lipid regimen. No changes to the medications were made.  

## 2014-08-18 ENCOUNTER — Ambulatory Visit (INDEPENDENT_AMBULATORY_CARE_PROVIDER_SITE_OTHER): Payer: Medicare HMO | Admitting: Cardiovascular Disease

## 2014-08-18 ENCOUNTER — Encounter: Payer: Self-pay | Admitting: Cardiovascular Disease

## 2014-08-18 VITALS — BP 160/80 | HR 76 | Ht 69.0 in | Wt 175.5 lb

## 2014-08-18 DIAGNOSIS — E1151 Type 2 diabetes mellitus with diabetic peripheral angiopathy without gangrene: Secondary | ICD-10-CM

## 2014-08-18 DIAGNOSIS — I6523 Occlusion and stenosis of bilateral carotid arteries: Secondary | ICD-10-CM | POA: Diagnosis not present

## 2014-08-18 DIAGNOSIS — E785 Hyperlipidemia, unspecified: Secondary | ICD-10-CM

## 2014-08-18 DIAGNOSIS — Z9889 Other specified postprocedural states: Secondary | ICD-10-CM | POA: Diagnosis not present

## 2014-08-18 DIAGNOSIS — I350 Nonrheumatic aortic (valve) stenosis: Secondary | ICD-10-CM | POA: Diagnosis not present

## 2014-08-18 DIAGNOSIS — E1159 Type 2 diabetes mellitus with other circulatory complications: Secondary | ICD-10-CM

## 2014-08-18 DIAGNOSIS — I159 Secondary hypertension, unspecified: Secondary | ICD-10-CM

## 2014-08-18 DIAGNOSIS — Z8679 Personal history of other diseases of the circulatory system: Secondary | ICD-10-CM

## 2014-08-18 NOTE — Assessment & Plan Note (Signed)
We have encouraged continued exercise, careful diet management in an effort to lose weight. 

## 2014-08-18 NOTE — Progress Notes (Signed)
Patient ID: Chase Bell, male    DOB: Jan 10, 1935, 80 y.o.   MRN: [id number]  HPI Comments: [name] is a pleasant 80 year old gentleman with significant peripheral vascular disease, abdominal aortic aneurysm with endograft placement, left femoral bypass, carotid endarterectomy on the right, moderate disease of the left carotid, diabetes2 with complications, history of GI bleed May 2008 with positive H. pylori, moderate aortic valve stenosis, hyperlipidemia,  Notes indicate renal artery stenosis, osteoarthritis, rheumatoid arthritis, on methotrexate. Prior history of alcohol, seizures, essential tremor. Baseline anemia Prior smoking history for 50 years, quit in 2006 Episode of near syncope in the summer of 2015 and again 05/16/2014 His vascular disease is followed by Dr. Lucky Cowboy. He has decreased pulse in his right upper extremity He presents today for follow-up of his echocardiogram showing severe aortic valve stenosis  In follow-up today, he reports having some shortness of breath with heavy exertion. Relatively sedentary at baseline, no regular exercise program. In general ports that he is doing well. He reports that he is finished having dental work done and would like to focus on his aortic valve. Wife presents with him today for discussion. Denies any lightheadedness or dizziness, able to do his ADLs without any difficulty. He has not been monitoring his blood pressure at home. Elevated on today's visit Previously had episode of severe shortness of breath several months ago. No further symptoms  EKG on today's visit shows normal sinus rhythm with rate 76 bpm, nonspecific T wave abnormality anterolateral leads  Other past medical history over the summer 2015 he had an episode where he was working outside, felt acutely hot, dizzy, almost passed out. Symptoms resolved once he got back into the house and rested. Symptoms were similar to 05/16/2014 when he developed lightheadedness.  He walked to the bathroom with worsening symptoms, felt sweaty, had pain across his shoulder blades. Wife reports he was very pale, he had to lay down on the ground, had incontinence. On the ground his blood pressure was 70 systolic, heart rates in the 50-60 range, Glucose in the 140s  echocardiogram showed progression of his aortic valve stenosis, now severe, peak gradient 79 mmHg, mean gradient 45. There is been moderate progression since last echocardiogram April 2014   echocardiogram 09/11/2012 showing normal ejection fraction, mitral valve regurgitation detailed as moderate to severe, also moderate to severe aortic valve stenosis, moderate TR. Review of the aortic valve parameters suggest moderate aortic valve stenosis. Maximum velocity 3.6 m/s, peak gradient 52 mm mercury, mean gradient 31 mm of mercury  Stress test 09/11/2012 shows no ischemia, normal ejection fraction. He did a Bruce protocol and achieved peak heart rate 102 beats per minute. This is actually a suboptimal stress test as target heart rate was not achieved. This was not mentioned in the final conclusions   history ofoil embolization for endoleak January 2015. This was done after sac growth was noted, type II endoleak identified. Surgeon was Dr. Lucky Cowboy Notes indicate recent hemoglobin A1c 6.0. Blood pressure has been well-controlled on his current medications. cholesterol less than 150. Most recent lab work shows total cholesterol 135, LDL 80, HDL 40       Allergies  Allergen Reactions  . Simvastatin     Myalgia     Outpatient Encounter Prescriptions as of 08/18/2014  Medication Sig  . aspirin 81 MG tablet Take 81 mg by mouth daily.  . carbidopa-levodopa (SINEMET IR) 25-100 MG per tablet Take 1 tablet by mouth daily.   . cilostazol (PLETAL)  100 MG tablet Take 100 mg by mouth daily.   Marland Kitchen esomeprazole (NEXIUM) 20 MG capsule TAKE ONE CAPSULE BY MOUTH TWICE DAILY  . ferrous fumarate (HEMOCYTE - 106 MG FE) 325 (106 FE) MG  TABS tablet Take 1 tablet by mouth every other day.  . folic acid (FOLVITE) 1 MG tablet Take 1 mg by mouth daily.  . hydroxychloroquine (PLAQUENIL) 200 MG tablet Take by mouth daily.  Marland Kitchen lisinopril (PRINIVIL,ZESTRIL) 20 MG tablet Take 20 mg by mouth daily.  . methotrexate (RHEUMATREX) 2.5 MG tablet Take 6 tablets weekly.  Marland Kitchen oxyCODONE-acetaminophen (PERCOCET/ROXICET) 5-325 MG per tablet Take by mouth every 4 (four) hours as needed for severe pain.  . rosuvastatin (CRESTOR) 20 MG tablet Take 20 mg by mouth daily.    Past Medical History  Diagnosis Date  . PVD (peripheral vascular disease)     with abdominal aortic aneurysm and claudication, s/p repair.  Marland Kitchen Hyperlipidemia   . Hypertension   . Valvular heart disease     moderate to severe aortic stenosis  . AAA (abdominal aortic aneurysm)     s/p repair/stent placement  . Heart murmur   . Carotid artery occlusion 2014  . Renal artery stenosis   . Diabetes mellitus without complication   . Basal cell carcinoma   . Kidney stones   . Bilateral cataracts   . Nephrolithiasis   . Tremor   . History of alcoholism   . History of seizures   . Anemia   . Osteoarthritis   . Rheumatoid arthritis   . Anemia     Past Surgical History  Procedure Laterality Date  . Femoral bypass      left leg  . Cataract extraction, bilateral    . Hernia repair  1996  . Abdominal aortic aneurysm repair  08/29/2007    stent graft repair  . Cystoscopy    . Carotid endarterectomy  2014    right     Social History  reports that he quit smoking about 9 years ago. His smoking use included Cigarettes. He has a 50 pack-year smoking history. His smokeless tobacco use includes Chew. He reports that he does not drink alcohol or use illicit drugs.  Family History family history includes Heart disease in his brother.  Review of Systems  Constitutional: Positive for fatigue.  Respiratory: Positive for shortness of breath.   Cardiovascular: Negative.    Gastrointestinal: Negative.   Musculoskeletal: Positive for arthralgias.  Neurological: Negative.   Hematological: Negative.   Psychiatric/Behavioral: Negative.   All other systems reviewed and are negative.   BP 160/80 mmHg  Pulse 76  Ht 5\' 9"  (1.753 m)  Wt 175 lb 8 oz (79.606 kg)  BMI 25.90 kg/m2  Physical Exam  Constitutional: He is oriented to person, place, and time. He appears well-developed and well-nourished.  HENT:  Head: Normocephalic.  Nose: Nose normal.  Mouth/Throat: Oropharynx is clear and moist.  Eyes: Conjunctivae are normal. Pupils are equal, round, and reactive to light.  Neck: Normal range of motion. Neck supple. No JVD present.  Cardiovascular: Normal rate, regular rhythm, S1 normal, S2 normal and intact distal pulses.  Exam reveals no gallop and no friction rub.   Murmur heard.  Systolic murmur is present with a grade of 3/6  Pulmonary/Chest: Effort normal and breath sounds normal. No respiratory distress. He has no wheezes. He has no rales. He exhibits no tenderness.  Abdominal: Soft. Bowel sounds are normal. He exhibits no distension. There is no tenderness.  Musculoskeletal: Normal range of motion. He exhibits no edema or tenderness.  Lymphadenopathy:    He has no cervical adenopathy.  Neurological: He is alert and oriented to person, place, and time. Coordination normal.  Skin: Skin is warm and dry. No rash noted. No erythema.  Psychiatric: He has a normal mood and affect. His behavior is normal. Judgment and thought content normal.      Assessment and Plan   Nursing note and vitals reviewed.

## 2014-08-18 NOTE — Patient Instructions (Addendum)
You are doing well. No medication changes were made.  WE WILL SCHEDULE A TEE FOR SEVERE AORTIC VALVE STENOSIS Your physician has requested that you have a TEE. During a TEE, sound waves are used to create images of your heart. It provides your doctor with information about the size and shape of your heart and how well your heart's chambers and valves are working. In this test, a transducer is attached to the end of a flexible tube that's guided down your throat and into your esophagus (the tube leading from you mouth to your stomach) to get a more detailed image of your heart. You are not awake for the procedure.  Please arrive at the [place] entrance of [facility] on Monday, April 18 @ 7:00 am Please do not eat or drink after midnight You may take all of your medications with a sip of water  Please monitor your blood pressure at home Call if the top number is >140 most of the time  Please call us if you have new issues that need to be addressed before your next appt.  Your physician wants you to follow-up in: 6 months.  You will receive a reminder letter in the mail two months in advance. If you don't receive a letter, please call our office to schedule the follow-up appointment.

## 2014-08-18 NOTE — Assessment & Plan Note (Addendum)
He is interested in further evaluation of his aortic valve stenosis. We will start with transesophageal echo to further estimate his aortic valve area. This has been scheduled for April 18. Instructions given If this shows severe aortic valve stenosis, would proceed with cardiac catheterization. Unclear if he would be a candidate for TAVR given his previous aortic endograft

## 2014-08-18 NOTE — Assessment & Plan Note (Signed)
carotid endarterectomy on the right, moderate disease on the left. Continue aggressive cholesterol management He also has decreased pulsation of the right upper extremity

## 2014-08-18 NOTE — Assessment & Plan Note (Signed)
Continue crestor, goal LDL <70

## 2014-09-01 ENCOUNTER — Ambulatory Visit
Admit: 2014-09-01 | Disposition: A | Discharge status: Home / Self Care | Payer: Self-pay | Attending: Cardiovascular Disease | Admitting: Cardiovascular Disease

## 2014-09-01 DIAGNOSIS — I351 Nonrheumatic aortic (valve) insufficiency: Secondary | ICD-10-CM | POA: Diagnosis not present

## 2014-09-02 NOTE — Op Note (Signed)
PATIENT NAME:  Chase Bell, Chase Bell MR#:  [id number] DATE OF BIRTH:  12-02-1934  DATE OF PROCEDURE:  03/21/2012  PREOPERATIVE DIAGNOSIS: Right distal ureteral calculi.   POSTOPERATIVE DIAGNOSIS: Right distal ureteral calculi.   PROCEDURES:  1. Right ureteroscopy with holmium laser lithotripsy/stone removal.  2. Placement of right ureteral stent.  SURGEON: Scott C. Bernardo Heater, MD  ASSISTANT: None.   ANESTHESIA: General.   INDICATION: 80 year old male recently presented to the Emergency Department with right renal colic. CT scan was performed which showed approximately 8 mm right mid ureteral calculus. A 5 mm right distal ureteral calculus was also identified but not mentioned in the radiology report. Looking at a prior CT this distal stone was also present approximately two years ago. After discussion of treatment options he has elected ureteroscopy. He continues to have intermittent renal colic.   DESCRIPTION OF PROCEDURE: Patient was taken to the Operating Room where a general anesthetic was administered. He was placed in the low lithotomy position and his external genitalia were prepped and draped in the usual fashion. Timeout was performed. A 21 French cystoscope with 30 degree lens was lubricated and passed under direct vision. The urethra was normal in caliber without stricture. Prostate shows mild lateral lobe enlargement with moderate bladder neck elevation. Bladder mucosa was closely inspected and there was no erythema, solid or papillary lesions. Ureteral orifices were normal in appearance. No efflux was seen from the right ureteral orifice after approximately 60 seconds of observation. A 0.035 guidewire was placed through the ureteroscope into the right ureteral orifice. Guidewire was passed up into the renal pelvis under fluoroscopic guidance without problems. The cystoscope was removed and a 6 French semirigid ureteroscope was passed under direct vision. A 0.025 guidewire was placed through  the ureteroscope and into the orifice and the ureteroscope was advanced over the wire. In the distal ureter the most distal stone was identified. The scope was negotiated past the stone and passed up to the mid ureter were a larger stone was identified. A 365 holmium laser fiber was placed through the ureteroscope. The more proximal stone was fragmented at a power of 3.6 watts. The scope was withdrawn and the most distal stone was fragmented on the same power setting. The distal fragment was removed with a 3 [place] nitinol basket. The ureteroscope was advanced to the mid ureteral stone and the fragments removed with the same basket. Scope was repassed up to the proximal ureter. No larger stone fragments seen. There were minute particles remaining. Ureteroscope was removed. A 6 French/24 cm Microvasive Contour ureteral stent was placed over the wire. There was good curl seen in the renal pelvis under fluoroscopy. The cystoscope was repassed and the distal end of the stent was well positioned in the bladder. Bladder was emptied and cystoscope was removed. B and O suppository was placed per rectum. Patient was taken to PAC-U in stable condition. There were no complications. EBL minimal.   ____________________________ Ronda Fairly. Bernardo Heater, MD scs:cms D: 03/21/2012 17:41:10 ET T: 03/22/2012 10:24:31 ET JOB#: [id number]  cc: Nicki Reaper C. Bernardo Heater, MD, <Dictator> Abbie Sons MD ELECTRONICALLY SIGNED 03/29/2012 8:04

## 2014-09-03 ENCOUNTER — Other Ambulatory Visit: Payer: Self-pay

## 2014-09-03 DIAGNOSIS — I350 Nonrheumatic aortic (valve) stenosis: Secondary | ICD-10-CM

## 2014-09-05 NOTE — Op Note (Signed)
PATIENT NAME:  Chase Bell, Chase Bell MR#:  [id number] DATE OF BIRTH:  1934-12-13  DATE OF PROCEDURE:  09/26/2012  PREOPERATIVE DIAGNOSES: 1.  High-grade right carotid artery stenosis.  2.  Coronary disease.  3.  Diabetes.  4.  Hypertension.  5.  Moderate left carotid artery stenosis.   POSTOPERATIVE DIAGNOSES:  1.  High-grade right carotid artery stenosis.  2.  Coronary disease.  3.  Diabetes.  4.  Hypertension.  5.  Moderate left carotid artery stenosis.   PROCEDURE:  Right carotid endarterectomy with CorMatrix arterial reconstruction.   SURGEON:  Algernon Huxley, MD  ANESTHESIA:  General.   BLOOD LOSS:  Approximately 100 mL.   INDICATION FOR PROCEDURE: This is a gentleman who we have followed for carotid disease. This has progressed over time, and now represents an 80% or greater lesion. We have now discussed carotid endarterectomy for stroke risk reduction. Risks and benefits were discussed and informed consent was obtained.   DESCRIPTION OF PROCEDURE: The patient is brought to the operative suite, and after an adequate level of general anesthesia obtained, he was placed in the modified beach chair position. His right neck and chest were sterilely prepped and draped, and a sterile surgical field was created. Incision was created just anterior to the sternocleidomastoid on the right. We dissected down to the platysma with electrocautery. The sternocleidomastoid was retracted laterally. Several venous branches were ligated and divided between silk ties. This exposed the carotid artery. The carotid bifurcation was dissected free and I anesthetized  the carotid bulb with lidocaine. The common carotid artery, superior thyroid artery, external carotid artery and internal carotid artery were all encircled with vessel loops. A large lymph node was dissected out and removed to facilitate our exposure, and the nerves were protected from harm. The patient was systemically heparinized with 6000 units of  intravenous heparin for systemic anticoagulation. This was allowed to circulate for several minutes. Control was then pulled up on the vessel loops. An anterior wall arteriotomy was created with an 11 blade and extended with Potts scissors. The Pruitt-Inahara shunt was placed first in the internal carotid artery, flushed and de-aired, then in the common carotid artery, flushed and de-aired, and then flow was restored. Approximately 2 minutes elapsed from clamping to shunt placement. I then performed endarterectomy in the standard fashion. The proximal endpoint cut flush with tenotomy scissors. An eversion endarterectomy was performed with the external carotid artery, and a nice feathered endpoint was created with gentle traction of the internal carotid artery. The internal carotid artery distal endpoint was tacked down with three 7-0 Prolene sutures, and all loose flecks were removed. The vessel was locally heparinized. A CorMatrix extracellular patch was brought on the field, cut and beveled. A 6-0 Prolene was started at the proximal suture line and run one half the length of the arteriotomy laterally and medially. A second 6-0 Prolene suture was started after the patch was cut and beveled to an appropriate length. We flushed several cardiac cycles out the external carotid artery and de-aired the vessel, then released control. On release, a single 6-0 Prolene patch was used for hemostasis. The wound was then irrigated. Evicel was placed. The wound was then closed with 3 interrupted 3-0 Vicryl. The platysma was closed with a running 3-0 Vicryl. The skin was closed with 4-0 Monocryl. Dermabond was placed as a dressing. The patient tolerated the procedure well, and was taken to the recovery room in stable condition.    ____________________________ Algernon Huxley, MD  jsd:mr D: 09/26/2012 16:04:28 ET T: 09/26/2012 19:41:48 ET JOB#: [id number]  cc: Algernon Huxley, MD, <Dictator> Algernon Huxley MD ELECTRONICALLY SIGNED  10/10/2012 11:32

## 2014-09-05 NOTE — Op Note (Signed)
PATIENT NAME:  Chase Bell, Chase Bell MR#:  [id number] DATE OF BIRTH:  05-28-1934  DATE OF PROCEDURE:  04/08/2013  PREOPERATIVE DIAGNOSIS: Cataract, right eye.   POSTOPERATIVE DIAGNOSIS: Cataract, right eye.  PROCEDURE PERFORMED: Extracapsular cataract extraction using phacoemulsification with placement of Alcon [id number], 22.0-diopter posterior chamber lens, serial number [id number].   SURGEON: Loura Back. Sherelle Castelli, M.D.   ANESTHESIA: Lidocaine 4% and 0.75% Marcaine,  a 50:50 mixture with 10 units/mL of Hylenex added, given as a peribulbar.   ANESTHESIOLOGIST: Dr. Kayleen Memos.   COMPLICATIONS: None.   ESTIMATED BLOOD LOSS: Less than 1 mL.   ASSISTANT:  None.  DESCRIPTION OF PROCEDURE:  The patient was brought to the operating room and given a peribulbar block.  The patient was then prepped and draped in the usual fashion.  The vertical rectus muscles were imbricated using 5-0 silk sutures.  These sutures were then clamped to the sterile drapes as bridle sutures.  A limbal peritomy was performed extending two clock hours and hemostasis was obtained with cautery.  A partial thickness scleral groove was made at the surgical limbus and dissected anteriorly in a lamellar dissection using an Alcon crescent knife.  The anterior chamber was entered superonasally with a Superblade and through the lamellar dissection with a 2.6 mm keratome.  DisCoVisc was used to replace the aqueous and a continuous tear capsulorrhexis was carried out.  Hydrodissection and hydrodelineation were carried out with balanced salt and a 27-gauge canula.  The nucleus was rotated to confirm the effectiveness of the hydrodissection.  Phacoemulsification was carried out using a divide-and-conquer technique.  Total ultrasound time was 1 minute and 22 seconds with an average power of 26.2%. CDE 37.30. No sutures placed.   Irrigation/aspiration was used to remove the residual cortex.  DisCoVisc was used to inflate the capsule and the  internal incision was enlarged to 3 mm with the crescent knife.  The intraocular lens was folded and inserted into the capsular bag using the AcrySert delivery system.  Irrigation/aspiration was used to remove the residual DisCoVisc.  Miostat was injected into the anterior chamber through the paracentesis track to inflate the anterior chamber and induce miosis.  A tenth of a milliliter of cefuroxime containing 1 mg of drug was injected via the paracentesis track. The wound was checked for leaks and none were found. The conjunctiva was closed with cautery and the bridle sutures were removed.  Two drops of 0.3% Vigamox were placed on the eye.   An eye shield was placed on the eye.  The patient was discharged to the recovery room in good condition.   ____________________________ Loura Back Jariyah Hackley, MD sad:np D: 04/08/2013 13:54:05 ET T: 04/08/2013 20:04:06 ET JOB#: [id number]  cc: Remo Lipps A. Larrie Lucia, MD, <Dictator> Martie Lee MD ELECTRONICALLY SIGNED 04/15/2013 13:28

## 2014-09-06 NOTE — Op Note (Signed)
PATIENT NAME:  Chase Bell, Chase Bell MR#:  [id number] DATE OF BIRTH:  January 30, 1935  DATE OF PROCEDURE:  06/03/2013  PREOPERATIVE DIAGNOSES: 1.  Abdominal aortic aneurysm.  2.  Status post endovascular repair with sac growth and type II endoleak.  3.  Carotid artery stenosis, status post endarterectomy.  4.  Hypertension.   POSTOPERATIVE DIAGNOSES: 1.  Abdominal aortic aneurysm.  2.  Status post endovascular repair with sac growth and type II endoleak.  3.  Carotid artery stenosis, status post endarterectomy.  4.  Hypertension.   PROCEDURES PERFORMED: 1.  Ultrasound guidance for vascular access to right femoral artery.  2.  Catheter placement into left lateral circumflex branch of femoral artery from right femoral approach.  3.  Catheter placement into left medial circumflex branch from right femoral approach.  4.  Aortogram and iliofemoral arteriogram.  5.  Coil embolization of left lateral circumflex branch with 3 mm diameter x 3 and 5 cm length Nester coils.  6.  Coil embolization of left medial circumflex branch with 3 mm diameter x 3 cm in length Nester coils and a 3 mm x 2 cm in length Tornado coil.  7.  StarClose closure device right femoral artery.   SURGEON: Algernon Huxley, MD   ANESTHESIA: Local with moderate conscious sedation.   ESTIMATED BLOOD LOSS: 25 mL.  FLUOROSCOPY TIME: 15 minutes.  CONTRAST USED: 95 mL Visipaque.   INDICATION FOR PROCEDURE: This is a 80 year old male with severe vascular disease. He has had a carotid endarterectomy. He has had a femoral-popliteal bypass. He has an abdominal aortic aneurysm, status post repair about 5 years ago. He over the past 12 months has demonstrated about 1 cm of sac growth, and the aneurysm sac is now almost 6 cm in maximal diameter. The endoleak was indeterminate on a CT scan, but by duplex it appeared to be a likely type II endoleak. He is brought in for an angiogram for further evaluation and potential treatment. Risks and  benefits were discussed. Informed consent was obtained.   DESCRIPTION OF PROCEDURE: The patient was brought to the vascular and interventional radiology suite. The groins were shaved and prepped, and a sterile surgical field was created. The right femoral head was localized with fluoroscopy. Right femoral artery was visualized with ultrasound and found to be patent. It was then accessed under direct ultrasound guidance without difficulty with a Seldinger needle. A stiff wire and 5-French sheath were then placed. A pigtail catheter was placed in the aorta just above the stent graft, and initial aortogram was performed. It was then pulled down to the midportion of the infrarenal aorta, and oblique arteriogram was performed. This demonstrated a good location of the endoprosthesis with patent renal arteries bilaterally. Both hypogastric arteries were occluded. The left had been chronically occluded, and the right had been coil embolized as part of his aneurysm repair in 2009. Initially on these images, no clear endoleak was seen. Imaging performed through the right femoral sheath showed no obvious branches feeding the aneurysm sac from the profunda or circumflex arteries. I crossed the aortic bifurcation with the RIM catheter, and a Terumo Advantage wire was used to advance down to the left external iliac artery and imaging was performed. This did show circumflex and profunda branches off the left that through multiple circuitous collaterals fed into the aneurysm sac area. I felt that coil embolization of several of these branches would be beneficial to decreasing the sac pressure. With the Advantage wire and a glide catheter,  I was able to gain access to what appeared to be the largest branch feeding the sac, and that was the lateral circumflex vessel off the left common femoral artery. I then used a Progreat microcatheter to get well out this branch. I then deployed a series of 3 mm coils in this branch to  obliterate the branch and diminish the flow all the way back to the proximal circumflex artery. This did diminish the flow into the sac, but there were still several other branches. I then used the RIM catheter and the Advantage wire, exchanging for the Progreat microcatheter and gained access to the medial circumflex branch of the left femoral artery now. I was able to advance well out this and into vessels that did appear to again feed the sac through circuitous collaterals. Again, a series of 3 mm diameter x 3 cm Nester coils and a 3 mm diameter x 2 cm Tornado coil in the proximal circumflex artery to obliterate this artery as well. At this point with the RIM catheter in the external iliac artery, there were still collaterals, but the flow to the sac was markedly diminished and not readily apparent. For fear of creating pelvic ischemia with further embolization, at this time I elected to not try to gain any other of the profunda branches and will follow the patient with duplex to look for sac regression. Should he have further sac growth, consideration for embolization of these profunda collaterals would be given or direct sac ejection, but at this point I elected to terminate the procedure. A StarClose closure device was deployed in the usual fashion with excellent hemostatic result. The patient tolerated the procedure well and was taken to the recovery room in stable condition.    ____________________________ Algernon Huxley, MD jsd:jcm D: 06/03/2013 11:22:59 ET T: 06/03/2013 19:23:26 ET JOB#: [id number]  cc: Algernon Huxley, MD, <Dictator> Youlanda Roys. Lovie Macadamia, MD Algernon Huxley MD ELECTRONICALLY SIGNED 06/06/2013 16:11

## 2014-09-15 ENCOUNTER — Other Ambulatory Visit: Payer: Self-pay

## 2014-09-15 DIAGNOSIS — I350 Nonrheumatic aortic (valve) stenosis: Secondary | ICD-10-CM

## 2014-09-16 ENCOUNTER — Telehealth: Payer: Self-pay

## 2014-09-16 ENCOUNTER — Other Ambulatory Visit: Payer: Self-pay

## 2014-09-16 DIAGNOSIS — R079 Chest pain, unspecified: Secondary | ICD-10-CM

## 2014-09-16 DIAGNOSIS — Z01818 Encounter for other preprocedural examination: Secondary | ICD-10-CM

## 2014-09-16 NOTE — Telephone Encounter (Signed)
Spoke w/ pt's wife.  She is aware that pt is sched for cath 09/18/14 @ 10:30, to arrive at [facility] @ 9:30. Pt will p/u orders to take to [facility] for preprocedure labs & cxr tomorrow am.

## 2014-09-17 ENCOUNTER — Other Ambulatory Visit: Payer: Self-pay | Admitting: Cardiovascular Disease

## 2014-09-17 ENCOUNTER — Ambulatory Visit
Admission: RE | Admit: 2014-09-17 | Discharge: 2014-09-17 | Disposition: A | Discharge status: Home / Self Care | Payer: Medicare HMO | Source: Ambulatory Visit | Attending: Cardiovascular Disease | Admitting: Cardiovascular Disease

## 2014-09-17 ENCOUNTER — Other Ambulatory Visit
Admission: RE | Admit: 2014-09-17 | Discharge: 2014-09-17 | Disposition: A | Discharge status: Home / Self Care | Payer: Medicare HMO | Source: Ambulatory Visit | Attending: Cardiovascular Disease | Admitting: Cardiovascular Disease

## 2014-09-17 ENCOUNTER — Encounter: Payer: Self-pay | Admitting: Cardiovascular Disease

## 2014-09-17 DIAGNOSIS — Z888 Allergy status to other drugs, medicaments and biological substances status: Secondary | ICD-10-CM | POA: Diagnosis not present

## 2014-09-17 DIAGNOSIS — I739 Peripheral vascular disease, unspecified: Secondary | ICD-10-CM | POA: Diagnosis not present

## 2014-09-17 DIAGNOSIS — Z9841 Cataract extraction status, right eye: Secondary | ICD-10-CM | POA: Diagnosis not present

## 2014-09-17 DIAGNOSIS — Z87891 Personal history of nicotine dependence: Secondary | ICD-10-CM | POA: Diagnosis not present

## 2014-09-17 DIAGNOSIS — Z87442 Personal history of urinary calculi: Secondary | ICD-10-CM | POA: Diagnosis not present

## 2014-09-17 DIAGNOSIS — R42 Dizziness and giddiness: Secondary | ICD-10-CM | POA: Diagnosis not present

## 2014-09-17 DIAGNOSIS — R079 Chest pain, unspecified: Secondary | ICD-10-CM

## 2014-09-17 DIAGNOSIS — I701 Atherosclerosis of renal artery: Secondary | ICD-10-CM | POA: Diagnosis not present

## 2014-09-17 DIAGNOSIS — I209 Angina pectoris, unspecified: Secondary | ICD-10-CM

## 2014-09-17 DIAGNOSIS — M069 Rheumatoid arthritis, unspecified: Secondary | ICD-10-CM | POA: Diagnosis not present

## 2014-09-17 DIAGNOSIS — B9681 Helicobacter pylori [H. pylori] as the cause of diseases classified elsewhere: Secondary | ICD-10-CM | POA: Diagnosis not present

## 2014-09-17 DIAGNOSIS — I350 Nonrheumatic aortic (valve) stenosis: Secondary | ICD-10-CM | POA: Diagnosis not present

## 2014-09-17 DIAGNOSIS — M1990 Unspecified osteoarthritis, unspecified site: Secondary | ICD-10-CM | POA: Diagnosis not present

## 2014-09-17 DIAGNOSIS — E1151 Type 2 diabetes mellitus with diabetic peripheral angiopathy without gangrene: Secondary | ICD-10-CM | POA: Diagnosis not present

## 2014-09-17 DIAGNOSIS — Z85828 Personal history of other malignant neoplasm of skin: Secondary | ICD-10-CM | POA: Diagnosis not present

## 2014-09-17 DIAGNOSIS — Z9842 Cataract extraction status, left eye: Secondary | ICD-10-CM | POA: Diagnosis not present

## 2014-09-17 DIAGNOSIS — E785 Hyperlipidemia, unspecified: Secondary | ICD-10-CM | POA: Diagnosis not present

## 2014-09-17 DIAGNOSIS — Z8249 Family history of ischemic heart disease and other diseases of the circulatory system: Secondary | ICD-10-CM | POA: Diagnosis not present

## 2014-09-17 DIAGNOSIS — Z7982 Long term (current) use of aspirin: Secondary | ICD-10-CM | POA: Diagnosis not present

## 2014-09-17 DIAGNOSIS — R634 Abnormal weight loss: Secondary | ICD-10-CM | POA: Diagnosis not present

## 2014-09-17 DIAGNOSIS — I714 Abdominal aortic aneurysm, without rupture: Secondary | ICD-10-CM | POA: Diagnosis not present

## 2014-09-17 DIAGNOSIS — Z79899 Other long term (current) drug therapy: Secondary | ICD-10-CM | POA: Diagnosis not present

## 2014-09-17 DIAGNOSIS — I2510 Atherosclerotic heart disease of native coronary artery without angina pectoris: Secondary | ICD-10-CM | POA: Diagnosis present

## 2014-09-17 DIAGNOSIS — Z01818 Encounter for other preprocedural examination: Secondary | ICD-10-CM

## 2014-09-17 LAB — CBC WITH DIFFERENTIAL/PLATELET
Basophils Absolute: 0 10*3/uL (ref 0–0.1)
Basophils Relative: 1 %
Eosinophils Absolute: 0.1 10*3/uL (ref 0–0.7)
Eosinophils Relative: 2 %
HCT: 37.2 % — ABNORMAL LOW (ref 40.0–52.0)
Hemoglobin: 11.7 g/dL — ABNORMAL LOW (ref 13.0–18.0)
LYMPHS ABS: 2.3 10*3/uL (ref 1.0–3.6)
LYMPHS PCT: 31 %
MCH: 26.3 pg (ref 26.0–34.0)
MCHC: 31.5 g/dL — ABNORMAL LOW (ref 32.0–36.0)
MCV: 83.5 fL (ref 80.0–100.0)
MONO ABS: 0.8 10*3/uL (ref 0.2–1.0)
Monocytes Relative: 10 %
NEUTROS ABS: 4.2 10*3/uL (ref 1.4–6.5)
Neutrophils Relative %: 56 %
Platelets: 188 10*3/uL (ref 150–440)
RBC: 4.45 MIL/uL (ref 4.40–5.90)
RDW: 18.5 % — ABNORMAL HIGH (ref 11.5–14.5)
WBC: 7.5 10*3/uL (ref 3.8–10.6)

## 2014-09-17 LAB — PROTIME-INR
INR: 0.99
Prothrombin Time: 13.3 seconds (ref 11.4–15.0)

## 2014-09-17 LAB — BASIC METABOLIC PANEL
Anion gap: 4 — ABNORMAL LOW (ref 5–15)
BUN: 19 mg/dL (ref 6–20)
CALCIUM: 8.8 mg/dL — AB (ref 8.9–10.3)
CO2: 27 mmol/L (ref 22–32)
CREATININE: 1.27 mg/dL — AB (ref 0.61–1.24)
Chloride: 111 mmol/L (ref 101–111)
GFR, EST NON AFRICAN AMERICAN: 53 mL/min — AB (ref >60)
GLUCOSE: 99 mg/dL (ref 65–99)
Potassium: 5 mmol/L (ref 3.5–5.1)
Sodium: 142 mmol/L (ref 135–145)

## 2014-09-18 ENCOUNTER — Encounter
Admission: RE | Disposition: A | Discharge status: Home / Self Care | Payer: Self-pay | Source: Ambulatory Visit | Attending: Cardiovascular Disease

## 2014-09-18 ENCOUNTER — Ambulatory Visit
Admission: RE | Admit: 2014-09-18 | Discharge: 2014-09-18 | Disposition: A | Discharge status: Home / Self Care | Payer: Medicare HMO | Source: Ambulatory Visit | Attending: Cardiovascular Disease | Admitting: Cardiovascular Disease

## 2014-09-18 ENCOUNTER — Encounter
Admission: RE | Disposition: A | Discharge status: Home / Self Care | Payer: Medicare HMO | Source: Ambulatory Visit | Attending: Cardiovascular Disease

## 2014-09-18 ENCOUNTER — Encounter: Payer: Self-pay | Admitting: *Deleted

## 2014-09-18 DIAGNOSIS — I701 Atherosclerosis of renal artery: Secondary | ICD-10-CM | POA: Insufficient documentation

## 2014-09-18 DIAGNOSIS — R634 Abnormal weight loss: Secondary | ICD-10-CM | POA: Insufficient documentation

## 2014-09-18 DIAGNOSIS — I714 Abdominal aortic aneurysm, without rupture: Secondary | ICD-10-CM | POA: Insufficient documentation

## 2014-09-18 DIAGNOSIS — Z87891 Personal history of nicotine dependence: Secondary | ICD-10-CM | POA: Insufficient documentation

## 2014-09-18 DIAGNOSIS — Z8249 Family history of ischemic heart disease and other diseases of the circulatory system: Secondary | ICD-10-CM | POA: Insufficient documentation

## 2014-09-18 DIAGNOSIS — Z888 Allergy status to other drugs, medicaments and biological substances status: Secondary | ICD-10-CM | POA: Insufficient documentation

## 2014-09-18 DIAGNOSIS — M1990 Unspecified osteoarthritis, unspecified site: Secondary | ICD-10-CM | POA: Insufficient documentation

## 2014-09-18 DIAGNOSIS — Z87442 Personal history of urinary calculi: Secondary | ICD-10-CM | POA: Insufficient documentation

## 2014-09-18 DIAGNOSIS — I209 Angina pectoris, unspecified: Secondary | ICD-10-CM

## 2014-09-18 DIAGNOSIS — I739 Peripheral vascular disease, unspecified: Secondary | ICD-10-CM | POA: Insufficient documentation

## 2014-09-18 DIAGNOSIS — M069 Rheumatoid arthritis, unspecified: Secondary | ICD-10-CM | POA: Insufficient documentation

## 2014-09-18 DIAGNOSIS — I350 Nonrheumatic aortic (valve) stenosis: Secondary | ICD-10-CM

## 2014-09-18 DIAGNOSIS — I2510 Atherosclerotic heart disease of native coronary artery without angina pectoris: Secondary | ICD-10-CM | POA: Insufficient documentation

## 2014-09-18 DIAGNOSIS — R55 Syncope and collapse: Secondary | ICD-10-CM | POA: Diagnosis present

## 2014-09-18 DIAGNOSIS — R42 Dizziness and giddiness: Secondary | ICD-10-CM | POA: Insufficient documentation

## 2014-09-18 DIAGNOSIS — E785 Hyperlipidemia, unspecified: Secondary | ICD-10-CM | POA: Insufficient documentation

## 2014-09-18 DIAGNOSIS — Z85828 Personal history of other malignant neoplasm of skin: Secondary | ICD-10-CM | POA: Insufficient documentation

## 2014-09-18 DIAGNOSIS — Z9842 Cataract extraction status, left eye: Secondary | ICD-10-CM | POA: Insufficient documentation

## 2014-09-18 DIAGNOSIS — Z79899 Other long term (current) drug therapy: Secondary | ICD-10-CM | POA: Insufficient documentation

## 2014-09-18 DIAGNOSIS — E1151 Type 2 diabetes mellitus with diabetic peripheral angiopathy without gangrene: Secondary | ICD-10-CM | POA: Insufficient documentation

## 2014-09-18 DIAGNOSIS — Z7982 Long term (current) use of aspirin: Secondary | ICD-10-CM | POA: Insufficient documentation

## 2014-09-18 DIAGNOSIS — B9681 Helicobacter pylori [H. pylori] as the cause of diseases classified elsewhere: Secondary | ICD-10-CM | POA: Insufficient documentation

## 2014-09-18 DIAGNOSIS — Z9841 Cataract extraction status, right eye: Secondary | ICD-10-CM | POA: Insufficient documentation

## 2014-09-18 HISTORY — PX: CARDIAC CATHETERIZATION: SHX172

## 2014-09-18 HISTORY — DX: Atherosclerotic heart disease of native coronary artery without angina pectoris: I25.10

## 2014-09-18 LAB — GLUCOSE, CAPILLARY: GLUCOSE-CAPILLARY: 80 mg/dL (ref 70–99)

## 2014-09-18 SURGERY — LEFT HEART CATH AND CORONARY ANGIOGRAPHY
Anesthesia: Moderate Sedation | Laterality: Right

## 2014-09-18 SURGERY — LEFT HEART CATH
Anesthesia: Moderate Sedation

## 2014-09-18 MED ORDER — SODIUM CHLORIDE 0.9 % IV SOLN: 250.0000 mL | INTRAVENOUS | Status: DC | PRN

## 2014-09-18 MED ORDER — IOHEXOL 350 MG/ML SOLN
INTRAVENOUS | Status: DC | PRN
  Administered 2014-09-18: 150 mL via INTRAVENOUS
  Administered 2014-09-18: 100 mL via INTRAVENOUS

## 2014-09-18 MED ORDER — ONDANSETRON HCL 4 MG/2ML IJ SOLN
4.0000 mg | Freq: Four times a day (QID) | INTRAMUSCULAR | Status: DC | PRN
Start: 2014-09-18 — End: 2014-09-18

## 2014-09-18 MED ORDER — FENTANYL CITRATE (PF) 100 MCG/2ML IJ SOLN
INTRAMUSCULAR | Status: AC
  Filled 2014-09-18: qty 2

## 2014-09-18 MED ORDER — HEPARIN (PORCINE) IN NACL 2-0.9 UNIT/ML-% IJ SOLN
INTRAMUSCULAR | Status: AC
  Filled 2014-09-18: qty 1000

## 2014-09-18 MED ORDER — DEXTROSE 50 % IV SOLN
INTRAVENOUS | Status: AC
  Administered 2014-09-18: 50 mL via INTRAVENOUS
  Filled 2014-09-18: qty 50

## 2014-09-18 MED ORDER — SODIUM CHLORIDE 0.9 % IJ SOLN
3.0000 mL | Freq: Two times a day (BID) | INTRAMUSCULAR | Status: DC
  Administered 2014-09-18: 3 mL via INTRAVENOUS

## 2014-09-18 MED ORDER — SODIUM CHLORIDE 0.9 % IJ SOLN: 3.0000 mL | Freq: Two times a day (BID) | INTRAMUSCULAR | Status: DC

## 2014-09-18 MED ORDER — SODIUM CHLORIDE 0.9 % IJ SOLN: 3.0000 mL | INTRAMUSCULAR | Status: DC | PRN

## 2014-09-18 MED ORDER — MIDAZOLAM HCL 2 MG/2ML IJ SOLN
INTRAMUSCULAR | Status: AC
  Filled 2014-09-18: qty 2

## 2014-09-18 MED ORDER — FENTANYL CITRATE (PF) 100 MCG/2ML IJ SOLN
INTRAMUSCULAR | Status: DC | PRN
  Administered 2014-09-18 (×2): 25 ug via INTRAVENOUS

## 2014-09-18 MED ORDER — ACETAMINOPHEN 325 MG PO TABS: 650.0000 mg | ORAL_TABLET | ORAL | Status: DC | PRN

## 2014-09-18 MED ORDER — SODIUM CHLORIDE 0.9 % WEIGHT BASED INFUSION: 1.0000 mL/kg/h | INTRAVENOUS | Status: DC

## 2014-09-18 MED ORDER — SODIUM CHLORIDE 0.9 % WEIGHT BASED INFUSION
3.0000 mL/kg/h | INTRAVENOUS | Status: DC
  Administered 2014-09-18: 3 mL/kg/h via INTRAVENOUS

## 2014-09-18 MED ORDER — DEXTROSE 50 % IV SOLN
1.0000 | Freq: Once | INTRAVENOUS | Status: AC
  Administered 2014-09-18: 50 mL via INTRAVENOUS

## 2014-09-18 MED ORDER — MIDAZOLAM HCL 2 MG/2ML IJ SOLN
INTRAMUSCULAR | Status: DC | PRN
  Administered 2014-09-18: 1 mg via INTRAVENOUS

## 2014-09-18 SURGICAL SUPPLY — 14 items
CATH INFINITI 5 FR 3DRC (CATHETERS) ×2 IMPLANT
CATH INFINITI 5 FR MPA2 (CATHETERS) ×2 IMPLANT
CATH INFINITI 5FR ANG PIGTAIL (CATHETERS) ×2 IMPLANT
CATH INFINITI 5FR JL4 (CATHETERS) ×2 IMPLANT
CATH INFINITI 5FR JL5 (CATHETERS) ×2 IMPLANT
CATH INFINITI JR4 5F (CATHETERS) ×2 IMPLANT
DEVICE CLOSURE MYNXGRIP 5F (Vascular Products) ×2 IMPLANT
KIT MANI 3VAL PERCEP (MISCELLANEOUS) ×2 IMPLANT
NEEDLE PERC 18GX7CM (NEEDLE) ×2 IMPLANT
NEEDLE SMART 18G ACCESS (NEEDLE) ×2 IMPLANT
PACK CARDIAC CATH (CUSTOM PROCEDURE TRAY) ×2 IMPLANT
SHEATH AVANTI 5FR X 11CM (SHEATH) ×2 IMPLANT
WIRE EMERALD 3MM-J .035X260CM (WIRE) ×2 IMPLANT
WIRE HITORQ VERSACORE ST 145CM (WIRE) ×2 IMPLANT

## 2014-09-18 NOTE — Discharge Instructions (Signed)

## 2014-09-18 NOTE — Progress Notes (Addendum)
Patient ID: Chase Manners Sr., male    DOB: May 19, 1934, 80 y.o.   MRN: [id number]  HPI Comments: Chase Bell is a pleasant 80 year old gentleman with significant peripheral vascular disease, abdominal aortic aneurysm with endograft placement, left femoral bypass, carotid endarterectomy on the right, moderate disease of the left carotid, diabetes2 with complications, history of GI bleed May 2008 with positive H. pylori, moderate aortic valve stenosis, hyperlipidemia,  Notes indicate renal artery stenosis, osteoarthritis, rheumatoid arthritis, on methotrexate. Prior history of alcohol, seizures, essential tremor. Baseline anemia Prior smoking history for 50 years, quit in 2006 Episode of near syncope in the summer of 2015 and again 05/16/2014 His vascular disease is followed by Dr. Lucky Cowboy. He has decreased pulse in his right upper extremity He presents today cardiac cath for preop eval prior to aortic valve surgery   As detailed in the note dated 08/18/14,  he reports having some shortness of breath with heavy exertion. Relatively sedentary at baseline, no regular exercise program. In general ports that he is doing well. He reports that he is finished having dental work done and would like to focus on his aortic valve. Denies any lightheadedness or dizziness, able to do his ADLs without any difficulty. He has not been monitoring his blood pressure at home. Elevated on today's visit Previously had episode of severe shortness of breath several months ago. No further symptoms  EKG on today's visit shows normal sinus rhythm with rate 76 bpm, nonspecific T wave abnormality anterolateral leads  Other past medical history over the summer 2015 he had an episode where he was working outside, felt acutely hot, dizzy, almost passed out. Symptoms resolved once he got back into the house and rested. Symptoms were similar to 05/16/2014 when he developed lightheadedness. He walked to the bathroom  with worsening symptoms, felt sweaty, had pain across his shoulder blades. Wife reports he was very pale, he had to lay down on the ground, had incontinence. On the ground his blood pressure was 70 systolic, heart rates in the 50-60 range, Glucose in the 140s  echocardiogram showed progression of his aortic valve stenosis, now severe, peak gradient 79 mmHg, mean gradient 45. There is been moderate progression since last echocardiogram April 2014   echocardiogram 09/11/2012 showing normal ejection fraction, mitral valve regurgitation detailed as moderate to severe, also moderate to severe aortic valve stenosis, moderate TR. Review of the aortic valve parameters suggest moderate aortic valve stenosis. Maximum velocity 3.6 m/s, peak gradient 52 mm mercury, mean gradient 31 mm of mercury  Stress test 09/11/2012 shows no ischemia, normal ejection fraction. He did a Bruce protocol and achieved peak heart rate 102 beats per minute. This is actually a suboptimal stress test as target heart rate was not achieved. This was not mentioned in the final conclusions   history ofoil embolization for endoleak January 2015. This was done after sac growth was noted, type II endoleak identified. Surgeon was Dr. Lucky Cowboy Notes indicate recent hemoglobin A1c 6.0. Blood pressure has been well-controlled on his current medications. cholesterol less than 150. Most recent lab work shows total cholesterol 135, LDL 80, HDL 40       Allergies  Allergen Reactions  . Simvastatin     Myalgia     Outpatient Encounter Prescriptions as of 08/18/2014  Medication Sig  . aspirin 81 MG tablet Take 81 mg by mouth daily.  . carbidopa-levodopa (SINEMET IR) 25-100 MG per tablet Take 1 tablet by mouth daily.   . cilostazol (PLETAL)  100 MG tablet Take 100 mg by mouth daily.   Marland Kitchen esomeprazole (NEXIUM) 20 MG capsule TAKE ONE CAPSULE BY MOUTH TWICE DAILY  . ferrous fumarate (HEMOCYTE - 106 MG FE) 325 (106 FE) MG TABS tablet Take 1 tablet by  mouth every other day.  . folic acid (FOLVITE) 1 MG tablet Take 1 mg by mouth daily.  . hydroxychloroquine (PLAQUENIL) 200 MG tablet Take by mouth daily.  Marland Kitchen lisinopril (PRINIVIL,ZESTRIL) 20 MG tablet Take 20 mg by mouth daily.  . methotrexate (RHEUMATREX) 2.5 MG tablet Take 6 tablets weekly.  Marland Kitchen oxyCODONE-acetaminophen (PERCOCET/ROXICET) 5-325 MG per tablet Take by mouth every 4 (four) hours as needed for severe pain.  . rosuvastatin (CRESTOR) 20 MG tablet Take 20 mg by mouth daily.    Past Medical History  Diagnosis Date  . PVD (peripheral vascular disease)     with abdominal aortic aneurysm and claudication, s/p repair.  Marland Kitchen Hyperlipidemia   . Hypertension   . Valvular heart disease     moderate to severe aortic stenosis  . AAA (abdominal aortic aneurysm)     s/p repair/stent placement  . Heart murmur   . Carotid artery occlusion 2014  . Renal artery stenosis   . Diabetes mellitus without complication   . Basal cell carcinoma   . Kidney stones   . Bilateral cataracts   . Nephrolithiasis   . Tremor   . History of alcoholism   . History of seizures   . Anemia   . Osteoarthritis   . Rheumatoid arthritis   . Anemia   . Coronary artery disease     Past Surgical History  Procedure Laterality Date  . Femoral bypass      left leg  . Cataract extraction, bilateral    . Hernia repair  1996  . Abdominal aortic aneurysm repair  08/29/2007    stent graft repair  . Cystoscopy    . Carotid endarterectomy  2014    right   . Carotid endarterectomy    . Abdominal aortic aneurysm repair      Social History  reports that he quit smoking about 9 years ago. His smoking use included Cigarettes. He has a 50 pack-year smoking history. His smokeless tobacco use includes Chew. He reports that he does not drink alcohol or use illicit drugs.  Family History family history includes Heart disease in his brother.  Review of Systems  Constitutional: Positive for fatigue.  Respiratory:  Positive for shortness of breath.   Cardiovascular: Negative.   Gastrointestinal: Negative.   Musculoskeletal: Positive for arthralgias.  Neurological: Negative.   Hematological: Negative.   Psychiatric/Behavioral: Negative.   All other systems reviewed and are negative.   BP 146/62 mmHg  Pulse 61  Temp(Src) 98.6 F (37 C)  Resp 18  Ht 5\' 9"  (1.753 m)  Wt 79.379 kg (175 lb)  BMI 25.83 kg/m2  Physical Exam  Constitutional: He is oriented to person, place, and time. He appears well-developed and well-nourished.  HENT:  Head: Normocephalic.  Nose: Nose normal.  Mouth/Throat: Oropharynx is clear and moist.  Eyes: Conjunctivae are normal. Pupils are equal, round, and reactive to light.  Neck: Normal range of motion. Neck supple. No JVD present.  Cardiovascular: Normal rate, regular rhythm, S1 normal, S2 normal and intact distal pulses.  Exam reveals no gallop and no friction rub.   Murmur heard.  Systolic murmur is present with a grade of 3/6  Pulmonary/Chest: Effort normal and breath sounds normal. No respiratory distress. He  has no wheezes. He has no rales. He exhibits no tenderness.  Abdominal: Soft. Bowel sounds are normal. He exhibits no distension. There is no tenderness.  Musculoskeletal: Normal range of motion. He exhibits no edema or tenderness.  Lymphadenopathy:    He has no cervical adenopathy.  Neurological: He is alert and oriented to person, place, and time. Coordination normal.  Skin: Skin is warm and dry. No rash noted. No erythema.  Psychiatric: He has a normal mood and affect. His behavior is normal. Judgment and thought content normal.      Assessment and Plan   Nursing note and vitals reviewed.          Aortic valve stenosis, severe - Minna Merritts, MD at 08/18/2014 5:54 PM     Status: Alison Stalling Related Problem: Aortic valve stenosis, severe   Expand All Collapse All   Cardiac cath today for pre surgical eval          Revision History        Date/Time User Action    > 08/18/2014 5:58 PM Minna Merritts, MD Edit     08/18/2014 5:54 PM Minna Merritts, MD Create              Carotid stenosis - Minna Merritts, MD at 08/18/2014 5:54 PM     Status: Written Related Problem: Carotid stenosis   Expand All Collapse All   carotid endarterectomy on the right, moderate disease on the left. Continue aggressive cholesterol management He also has decreased pulsation of the right upper extremity                 DM (diabetes mellitus) type II controlled peripheral vascular disorder - Minna Merritts, MD at 08/18/2014 5:57 PM     Status: Written Related Problem: DM (diabetes mellitus) type II controlled peripheral vascular disorder   Expand All Collapse All   We have encouraged continued exercise, careful diet management in an effort to lose weight.            Hyperlipidemia - Minna Merritts, MD at 08/18/2014 5:57 PM     Status: Written Related Problem: Hyperlipidemia   Expand All Collapse All   Continue crestor, goal LDL <70

## 2014-09-22 ENCOUNTER — Other Ambulatory Visit: Payer: Self-pay

## 2014-09-22 DIAGNOSIS — I350 Nonrheumatic aortic (valve) stenosis: Secondary | ICD-10-CM

## 2014-09-23 ENCOUNTER — Other Ambulatory Visit: Payer: Self-pay

## 2014-09-23 ENCOUNTER — Encounter: Payer: Self-pay | Admitting: Cardiovascular Disease

## 2014-09-25 ENCOUNTER — Encounter: Payer: Self-pay | Admitting: Surgery

## 2014-09-25 ENCOUNTER — Institutional Professional Consult (permissible substitution) (INDEPENDENT_AMBULATORY_CARE_PROVIDER_SITE_OTHER): Payer: Medicare HMO | Admitting: Surgery

## 2014-09-25 VITALS — BP 112/73 | HR 60 | Resp 20 | Ht 69.0 in | Wt 173.0 lb

## 2014-09-30 ENCOUNTER — Encounter: Payer: Self-pay | Admitting: Surgery

## 2014-09-30 ENCOUNTER — Institutional Professional Consult (permissible substitution) (INDEPENDENT_AMBULATORY_CARE_PROVIDER_SITE_OTHER): Payer: Medicare HMO | Admitting: Surgery

## 2014-09-30 VITALS — BP 180/90 | HR 88 | Resp 19 | Ht 69.0 in | Wt 173.0 lb

## 2014-09-30 DIAGNOSIS — I350 Nonrheumatic aortic (valve) stenosis: Secondary | ICD-10-CM | POA: Diagnosis not present

## 2014-09-30 NOTE — Progress Notes (Signed)
Patient ID: Chase Manners Sr., male   DOB: 1934/08/05, 80 y.o.   MRN: [id number]  [facility] REPORT  Referring Provider is Gollan, Kathlene November, MD PCP is Juluis Pitch, MD  Chief Complaint  Patient presents with  . Aortic Stenosis    eval for TAVR    HPI:  The patient is a 80 year old vasculopath with a history of AODM, hyperlipidemia, remote smoking, rheumatoid arthritis on methotrexate for years and aortic stenosis. He underwent AAA repair with a stent graft approximately 6 years ago and then repair of a type 2 endoleak in 05/1013. He underwent right CEA in 09/2012. His most recent carotid ultrasound on 04/25/2014 showed a patent right internal carotid. There was a 50-69% left internal carotid artery stenosis and there were elevated velocities consistent with stenosis of the right subclavian or innominate artery and the left subclavian artery. This has been followed by Dr. Lucky Cowboy in [place].   He reports an episode of near syncope last summer while repairing his lawn mower and then again in January 2016 after helping his wife cook a New Years meal. With both of these episodes he felt terrible and weak He reports having progressive exertional shortness of breath and fatigue but usually only happens with heavy exertion. He can do his normal daily activities around the house without difficulty but his wife says that he sleeps a lot. He denies any chest pain, orthopnea, or PND. He recently underwent cardiac cath by Dr. Rockey Situ showing mild non-obstructive coronary disease. The aortic valve was not crossed.   The patient is retired and lives with his wife in [place]. He is fairly sedentary and does not exercise but does play golf.  Past Medical History  Diagnosis Date  . PVD (peripheral vascular disease)     with abdominal aortic aneurysm and claudication, s/p repair.  Marland Kitchen Hyperlipidemia     . Hypertension   . Valvular heart disease     moderate to severe aortic stenosis  . AAA (abdominal aortic aneurysm)     s/p repair/stent placement  . Heart murmur   . Carotid artery occlusion 2014  . Renal artery stenosis   . Diabetes mellitus without complication   . Basal cell carcinoma   . Kidney stones   . Bilateral cataracts   . Nephrolithiasis   . Tremor   . History of alcoholism   . History of seizures   . Anemia   . Osteoarthritis   . Rheumatoid arthritis   . Anemia   . Coronary artery disease     Past Surgical History  Procedure Laterality Date  . Femoral bypass      left leg  . Cataract extraction, bilateral    . Hernia repair  1996  . Abdominal aortic aneurysm repair  08/29/2007    stent graft repair  . Cystoscopy    . Carotid endarterectomy  2014    right   . Carotid endarterectomy    . Abdominal aortic aneurysm repair    . Cardiac catheterization N/A 09/18/2014    Procedure: Left Heart Cath;  Surgeon: Minna Merritts, MD;  Location: [facility];  Service: Cardiovascular;  Laterality: N/A;    Family History  Problem Relation Age of Onset  . Heart disease Brother     History   Social History  . Marital Status: Married    Spouse Name: N/A  . Number of Children:  N/A  . Years of Education: N/A   Occupational History  . Not on file.   Social History Main Topics  . Smoking status: Former Smoker -- 1.00 packs/day for 50 years    Types: Cigarettes    Quit date: 04/16/2005  . Smokeless tobacco: Current User    Types: Chew  . Alcohol Use: No  . Drug Use: No  . Sexual Activity: Not on file   Other Topics Concern  . Not on file   Social History Narrative    Current Outpatient Prescriptions  Medication Sig Dispense Refill  . aspirin 81 MG tablet Take 81 mg by mouth daily.    . carbidopa-levodopa (SINEMET IR) 25-100 MG per tablet Take 1 tablet by mouth daily.     . cilostazol (PLETAL) 100 MG tablet Take 100 mg by mouth daily.     Marland Kitchen  esomeprazole (NEXIUM) 20 MG capsule TAKE ONE CAPSULE BY MOUTH TWICE DAILY    . ferrous fumarate (HEMOCYTE - 106 MG FE) 325 (106 FE) MG TABS tablet Take 1 tablet by mouth daily.    . folic acid (FOLVITE) 1 MG tablet Take 1 mg by mouth daily.    . hydroxychloroquine (PLAQUENIL) 200 MG tablet Take by mouth daily.    Marland Kitchen lisinopril (PRINIVIL,ZESTRIL) 20 MG tablet Take 20 mg by mouth daily.    . methotrexate (RHEUMATREX) 2.5 MG tablet Take 6 tablets weekly.    . rosuvastatin (CRESTOR) 20 MG tablet Take 20 mg by mouth daily.     No current facility-administered medications for this visit.    Allergies  Allergen Reactions  . Simvastatin     Myalgia       Review of Systems:   General:  normal appetite, decreased energy, no weight gain, no weight loss, no fever  Cardiac:  no chest pain with exertion, no chest pain at rest, moderate SOB with heavy exertion, no resting SOB, no PND, no orthopnea, no palpitations, no arrhythmia, no atrial fibrillation, no LE edema, two dizzy spells with near syncope  Respiratory:   no home oxygen, no productive cough, no dry cough, no bronchitis, no wheezing, no hemoptysis, no asthma, no pain with inspiration or cough, no sleep apnea, no CPAP at night  GI:   no difficulty swallowing, has reflux, no frequent heartburn, no hiatal hernia, no abdominal pain, no constipation, no diarrhea, no hematochezia, no hematemesis, no melena  GU:   no dysuria,  no frequency, no urinary tract infection, no hematuria, no enlarged prostate, has kidney stones, no kidney disease  Vascular:  has pain suggestive of claudication, no pain in feet, no leg cramps, no varicose veins, no DVT, no non-healing foot ulcer  Neuro:   no stroke, no TIA's, no seizures, no headaches, notemporary blindness one eye,  no slurred speech, no peripheral neuropathy, no chronic pain, no instability of gait, no memory/cognitive dysfunction  Musculoskeletal: has arthritis, has joint swelling, no myalgias, no  difficulty walking, decreased mobility   Skin:   no rash, no itching, no skin infections, no pressure sores or ulcerations  Psych:   no anxiety, no depression, no nervousness, no unusual recent stress  Eyes:   no blurry vision, no floaters, no recent vision changes,  wears glasses or contacts  ENT:   has hearing loss, no loose or painful teeth, wears dentures, last saw dentist recently. He had all of his remaining teeth removed and has an upper denture. Lower denture no made yet  Hematologic:  has easy bruising, no abnormal  bleeding, no clotting disorder, no frequent epistaxis  Endocrine:  has diabetes, does check CBG's at home periodically.      Physical Exam:   BP 180/90 mmHg  Pulse 88  Resp 19  Ht 5\' 9"  (1.753 m)  Wt 173 lb (78.472 kg)  BMI 25.54 kg/m2  SpO2 95%  General:  Elderly but  well-appearing  HEENT:  Unremarkable , [place]/AT, PERRLA, EOMI, oropharynx clear.  Neck:   no JVD, no bruits, no adenopathy or thyromegaly  Chest:   clear to auscultation, symmetrical breath sounds, no wheezes, no rhonchi   CV:   Regularly irregular rhythm that sounds like bigeminy, grade III/VI high pitched crescendo/decrescendo murmur heard best at RSB,  no diastolic murmur  Abdomen:  soft, non-tender, no masses or organomegaly   Extremities:  warm, well-perfused, pulses not palpable in feet, no LE edema  Rectal/GU  Deferred  Neuro:   Grossly non-focal and symmetrical throughout  Skin:   Clean and dry, no rashes, no breakdown   Diagnostic Tests:              *[facility]*         [address]            [place], [place] [id number]              [phone]  ------------------------------------------------------------------- Transthoracic Echocardiography  Patient:  Raeden, Schippers MR #:    [id number] Study Date: 05/23/2014 Gender:   M Age:    45 Height:   175.3 cm Weight:   80.3 kg BSA:    1.99  m^2 Pt. Status: Room:  ATTENDING  Esmond Plants, MD, [facility] SONOGRAPHER [facility] ORDERING   [place], Tim REFERRING  [place], Tim PERFORMING  [facility], [place]  cc:  -------------------------------------------------------------------  ------------------------------------------------------------------- History:  PMH:  Syncope and murmur. Coronary artery disease. Aortic valve disease. Risk factors: Hypertension. Diabetes mellitus. Dyslipidemia.  ------------------------------------------------------------------- Study Conclusions  - Left ventricle: The cavity size was normal. There was mild concentric hypertrophy. Systolic function was normal. Wall motion was normal; there were no regional wall motion abnormalities. Doppler parameters are consistent with abnormal left ventricular relaxation (grade 1 diastolic dysfunction). - Aortic valve: Transvalvular velocity was increased. There was severe stenosis. There was mild to moderate regurgitation. Mean gradient (S): 45 mm Hg. Peak gradient (S): 79 mm Hg. Valve area (VTI): 0.82 cm^2. - Mitral valve: Calcified annulus. - Left atrium: The atrium was moderately dilated. - Right ventricle: Systolic function was normal. - Pulmonary arteries: Systolic pressure was within the normal range.  ------------------------------------------------------------------- Labs, prior tests, procedures, and surgery: Echocardiography (April 2014).  The aortic valve showed moderate to severe stenosis. Aortic valve: peak gradient of 52 mm Hg.  Transthoracic echocardiography. M-mode, complete 2D, spectral Doppler, and color Doppler. Birthdate: Patient birthdate: [birth year]. Age: Patient is 80 yr old. Sex: Gender: male. BMI: 26.1 kg/m^2. Blood pressure:   142/72 Patient status: Inpatient. Study date: Study date: 05/23/2014. Study time:  01:08 PM.  -------------------------------------------------------------------  ------------------------------------------------------------------- Left ventricle: The cavity size was normal. There was mild concentric hypertrophy. Systolic function was normal. Wall motion was normal; there were no regional wall motion abnormalities. Doppler parameters are consistent with abnormal left ventricular relaxation (grade 1 diastolic dysfunction).  ------------------------------------------------------------------- Aortic valve: Poorly visualized. Trileaflet; normal thickness, severely calcified leaflets. Mobility was not restricted. Doppler: Transvalvular velocity was increased. There was severe stenosis. There was mild to moderate regurgitation.  VTI ratio of LVOT to aortic valve: 0.21. Valve area (VTI): 0.82 cm^2.  Indexed valve area (VTI): 0.41 cm^2/m^2. Valve area (Vmax): 0.81 cm^2. Indexed valve area (Vmax): 0.41 cm^2/m^2. Mean velocity ratio of LVOT to aortic valve: 0.23. Valve area (Vmean): 0.86 cm^2. Indexed valve area (Vmean): 0.43 cm^2/m^2.  Mean gradient (S): 45 mm Hg. Peak gradient (S): 79 mm Hg.  ------------------------------------------------------------------- Aorta: Aortic root: The aortic root was mildly dilated.  ------------------------------------------------------------------- Mitral valve:  Calcified annulus. Mobility was not restricted. Doppler: Transvalvular velocity was within the normal range. There was no evidence for stenosis. There was no regurgitation.  Valve area by pressure half-time: 1.76 cm^2. Indexed valve area by pressure half-time: 0.88 cm^2/m^2.  ------------------------------------------------------------------- Left atrium: The atrium was moderately dilated.  ------------------------------------------------------------------- Right ventricle: The cavity size was normal. Wall thickness was normal. Systolic function was  normal.  ------------------------------------------------------------------- Pulmonic valve:  Structurally normal valve.  Cusp separation was normal. Doppler: Transvalvular velocity was within the normal range. There was no evidence for stenosis. There was no regurgitation.  ------------------------------------------------------------------- Tricuspid valve:  Structurally normal valve.  Doppler: Transvalvular velocity was within the normal range. There was no regurgitation.  ------------------------------------------------------------------- Pulmonary artery:  The main pulmonary artery was normal-sized. Systolic pressure was within the normal range.  ------------------------------------------------------------------- Right atrium: The atrium was normal in size.  ------------------------------------------------------------------- Pericardium: There was no pericardial effusion.  ------------------------------------------------------------------- Systemic veins: Inferior vena cava: The vessel was normal in size.  ------------------------------------------------------------------- Measurements  Left ventricle              Value     Reference LV ID, ED, PLAX chordal          45.7 mm    43 - 52 LV ID, ES, PLAX chordal          28  mm    23 - 38 LV fx shortening, PLAX chordal      39  %    >=29 LV PW thickness, ED            18.3 mm    --------- IVS/LV PW ratio, ED            0.83      <=1.3 Stroke volume, 2D             78  ml    --------- Stroke volume/bsa, 2D           39  ml/m^2  --------- LV e&', lateral              3.98 cm/s   --------- LV E/e&', lateral             12.14     --------- LV e&', medial               4.05 cm/s   --------- LV E/e&', medial              11.93      --------- LV e&', average              4.02 cm/s   --------- LV E/e&', average             12.03     ---------  Ventricular septum            Value     Reference IVS thickness, ED             15.2 mm    ---------  LVOT                   Value     Reference  LVOT ID, S                22  mm    --------- LVOT area                 3.8  cm^2   --------- LVOT ID                  22  mm    --------- LVOT peak velocity, S           94.4 cm/s   --------- LVOT mean velocity, S           70.7 cm/s   --------- LVOT VTI, S                20.4 cm    --------- LVOT peak gradient, S           4   mm Hg  --------- Stroke volume (SV), LVOT DP        77.5 ml    --------- Stroke index (SV/bsa), LVOT DP      39  ml/m^2  ---------  Aortic valve               Value     Reference Aortic valve mean velocity, S       312  cm/s   --------- Aortic valve VTI, S            95.1 cm    --------- Aortic mean gradient, S          45  mm Hg  --------- Aortic peak gradient, S          79  mm Hg  --------- VTI ratio, LVOT/AV            0.21      --------- Aortic valve area, VTI          0.82 cm^2   --------- Aortic valve area/bsa, VTI        0.41 cm^2/m^2 --------- Aortic valve area, peak velocity     0.81 cm^2   --------- Aortic valve area/bsa, peak        0.41 cm^2/m^2 --------- velocity Velocity ratio, mean, LVOT/AV       0.23      --------- Aortic valve area, mean velocity     0.86 cm^2   --------- Aortic valve area/bsa, mean        0.43 cm^2/m^2 --------- velocity Aortic regurg pressure half-time      616  ms    ---------  Aorta                   Value     Reference Aortic root ID, ED            41  mm    ---------  Left atrium                Value     Reference LA ID, A-P, ES              49  mm    --------- LA ID/bsa, A-P          (H)   2.46 cm/m^2  <=2.2 LA volume, S               60  ml    --------- LA volume/bsa, S             30.2 ml/m^2  --------- LA volume, ES, 1-p A4C  70  ml    --------- LA volume/bsa, ES, 1-p A4C        35.2 ml/m^2  --------- LA volume, ES, 1-p A2C          50  ml    --------- LA volume/bsa, ES, 1-p A2C        25.1 ml/m^2  ---------  Mitral valve               Value     Reference Mitral E-wave peak velocity        48.3 cm/s   --------- Mitral A-wave peak velocity        109  cm/s   --------- Mitral deceleration time     (H)   335  ms    150 - 230 Mitral pressure half-time         125  ms    --------- Mitral E/A ratio, peak          0.4      --------- Mitral valve area, PHT, DP        1.76 cm^2   --------- Mitral valve area/bsa, PHT, DP      0.88 cm^2/m^2 --------- Mitral maximal regurg velocity,      385  cm/s   --------- PISA  Pulmonary arteries            Value     Reference PA pressure, S, DP            30  mm Hg  <=30  Tricuspid valve              Value     Reference Tricuspid regurg peak velocity      261  cm/s   --------- Tricuspid peak RV-RA gradient       27  mm Hg  ---------  Systemic veins              Value     Reference Estimated CVP               3   mm Hg  ---------  Right ventricle               Value     Reference RV pressure, S, DP            30  mm Hg  <=30 RV s&', lateral, S             12.5 cm/s   ---------  Legend: (L) and (H) mark values outside specified reference range.  ------------------------------------------------------------------- Prepared and Electronically Authenticated by  Esmond Plants, MD, [facility] 2016-01-08T17:11:19   Conclusion     Nondominant right coronary system.  Ostial RCA not well visualized, unable to exclude stenosis (small to moderate sized vessel)  Dist LAD lesion, 40% stenosed.  Otherwise mild lumenal irregularies  Unable to cross the aortic valve secondary to severe stenosis.  Challenging case secondary to access in the right groin (significant scar tissue) and difficulty extending through the AAA endograft.     Coronary Findings    Dominance: Left   Left Anterior Descending   . Dist LAD lesion, 40% stenosed.      Left Heart    Aortic Valve There is severe aortic valve stenosis. The aortic valve is calcified. There is restricted aortic valve motion.   Aorta The size of the ascending aorta is normal.    Coronary Diagrams    Diagnostic Diagram  Hemo Data    AO Systolic Cath Pressure AO Diastolic Cath Pressure AO Mean Cath Pressure   129 59 mmHg 82 mmHg   123 56 mmHg 80 mmHg   119 44 mmHg 69 mmHg     Procedure: AV Replacement  Risk of Mortality: 2.168%  Morbidity or Mortality: 16.584%  Long Length of Stay: 5.461%  Short Length of Stay: 33.983%  Permanent Stroke: 2.272%  Prolonged Ventilation: 7.364%  DSW Infection: 0.245%  Renal Failure: 5.194%  Reoperation: 8.92%   Impression:  This 80 year old gentleman has severe, symptomatic Stage D aortic stenosis with NYHA stage II functional class. He has had two episodes of profound weakness and near syncope. I have personally reviewed his 2D echo and cardiac cath films. He has a trileaflet aortic  valve with severely calcified leaflets with a mean gradient of 45 mm Hg. He has no significant coronary disease. I think aortic valve replacement is indicated in this patient. His STS PROM score for open surgical AVR is only 2.2% but I think it is probably significantly higher considering his diffuse, calcific vascular disease with an abdominal aortic stent graft, left gortex femoral popliteal bypass, moderate left carotid stenosis, stenosis of the innominate/right subclavian and left subclavian arteries, diabetes, rheumatoid arthritis on chronic methotrexate, and reduced mobility.   The patient has been counseled extensively as to the relative risks and benefits of all options for the treatment of severe aortic stenosis including long term medical therapy, conventional surgery for aortic valve replacement, and transcatheter aortic valve replacement.  He and his wife and daughter feel that he would have a tough time recovering from open surgery and would like to pursue TAVR if possible. I will plan to do a gated cardiac CT as well as CTA of the chest, abdomen and pelvis to further evaluate the feasibility of TAVR in this patient.  Plan:  I will see him back after the scans have been done to discuss the results and make further plans after that.   Gaye Pollack, MD

## 2014-10-01 ENCOUNTER — Other Ambulatory Visit: Payer: Self-pay | Admitting: *Deleted

## 2014-10-01 DIAGNOSIS — I350 Nonrheumatic aortic (valve) stenosis: Secondary | ICD-10-CM

## 2014-10-07 ENCOUNTER — Ambulatory Visit (HOSPITAL_COMMUNITY)
Admission: RE | Admit: 2014-10-07 | Discharge: 2014-10-07 | Disposition: A | Discharge status: Home / Self Care | Payer: Medicare HMO | Source: Ambulatory Visit | Attending: Surgery | Admitting: Surgery

## 2014-10-07 ENCOUNTER — Other Ambulatory Visit (HOSPITAL_COMMUNITY): Payer: Medicare HMO

## 2014-10-07 DIAGNOSIS — I350 Nonrheumatic aortic (valve) stenosis: Secondary | ICD-10-CM | POA: Diagnosis not present

## 2014-10-07 DIAGNOSIS — R911 Solitary pulmonary nodule: Secondary | ICD-10-CM | POA: Insufficient documentation

## 2014-10-07 DIAGNOSIS — R590 Localized enlarged lymph nodes: Secondary | ICD-10-CM | POA: Insufficient documentation

## 2014-10-07 DIAGNOSIS — Z01818 Encounter for other preprocedural examination: Secondary | ICD-10-CM | POA: Diagnosis present

## 2014-10-07 DIAGNOSIS — I2510 Atherosclerotic heart disease of native coronary artery without angina pectoris: Secondary | ICD-10-CM | POA: Diagnosis not present

## 2014-10-07 LAB — PULMONARY FUNCTION TEST
DL/VA % pred: 80 %
DL/VA: 3.58 ml/min/mmHg/L
DLCO UNC % PRED: 50 %
DLCO UNC: 15 ml/min/mmHg
FEF 25-75 PRE: 1.3 L/s
FEF 25-75 Post: 1.86 L/sec
FEF2575-%Change-Post: 43 %
FEF2575-%Pred-Post: 97 %
FEF2575-%Pred-Pre: 68 %
FEV1-%Change-Post: 8 %
FEV1-%PRED-POST: 84 %
FEV1-%Pred-Pre: 77 %
FEV1-PRE: 2.11 L
FEV1-Post: 2.3 L
FEV1FVC-%CHANGE-POST: 4 %
FEV1FVC-%Pred-Pre: 96 %
FEV6-%Change-Post: 4 %
FEV6-%Pred-Post: 88 %
FEV6-%Pred-Pre: 84 %
FEV6-Post: 3.12 L
FEV6-Pre: 2.99 L
FEV6FVC-%CHANGE-POST: 0 %
FEV6FVC-%Pred-Post: 106 %
FEV6FVC-%Pred-Pre: 106 %
FVC-%Change-Post: 4 %
FVC-%PRED-POST: 83 %
FVC-%Pred-Pre: 79 %
FVC-POST: 3.16 L
FVC-Pre: 3.03 L
POST FEV6/FVC RATIO: 99 %
PRE FEV1/FVC RATIO: 70 %
Post FEV1/FVC ratio: 73 %
Pre FEV6/FVC Ratio: 99 %
RV % pred: 99 %
RV: 2.48 L
TLC % PRED: 84 %
TLC: 5.64 L

## 2014-10-07 IMAGING — CT CT HEART MORP W/ CTA COR W/ SCORE W/ CA W/CM &/OR W/O CM
1 series · 1 of 14 positions shown · non-contrast
Comparison: Multiple exams, including 09/17/2014

CLINICAL DATA: Aortic stenosis

EXAM:
Cardiac TAVR CT
TECHNIQUE: The patient was scanned on a Philips 256 scanner. A 120 kV
retrospective scan was triggered in the descending thoracic aorta at
111 HU's. Gantry rotation speed was 270 msecs and collimation was .9
mm. No beta blockade or nitro were given. The 3D data set was
reconstructed in 5% intervals of the R-R cycle. Systolic and
diastolic phases were analyzed on a dedicated work station using
MPR, MIP and VRT modes. The patient received 80 cc of contrast.

[Series 201: sa stack · 0.20mm/px · 1 of 14 slices shown]
[im 8/14]
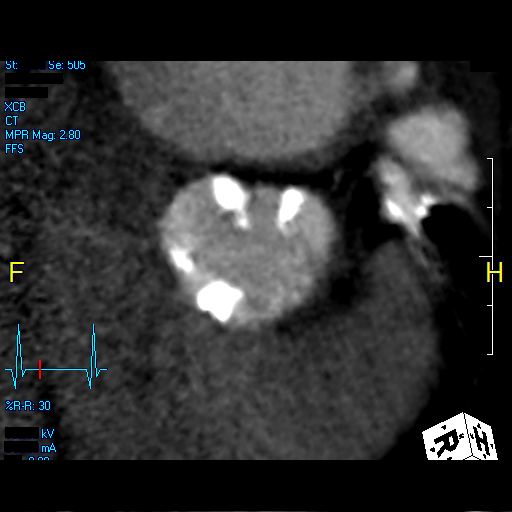

[1 of 14 positions shown; findings below may reference images not displayed]

FINDINGS: Aortic Valve: Heavily calcified trileaflet aortic valve. No
significant calcification of the STJ or annulus. No significant
mitral annular calcification or basal septal bulging into the LVOT

Aorta: Mildly dilated aortic root. Mild calcification of the arch
and descending thoracic aorta with mild mural aortic debris in the
descending thoracic aorta

Sinotubular Junction:  31 mm

Ascending Thoracic Aorta:  4.2 cm

Aortic Arch:  32 mm

Descending Thoracic Aorta:  30 mm

Sinus of Valsalva Measurements:

Non-coronary:  36 mm

Right -coronary:  35.7 mm

Left -coronary:  35.6 mm

Coronary Artery Height above Annulus:

Left Main:  13.8 mm above annulus

Right Coronary:  17.7 mm above annulus

Virtual Basal Annulus Measurements:

Maximum/Minimum Diameter:  27 x 24.5 mm in 30% phase

Perimeter:  85 mm

Area:  539 mm2

Coronary Arteries: Calcified sufficient height above annulus for
deployment see cath report

Optimum Fluoroscopic Angle for Delivery: LAO 19 degrees Cranial 1
degree
IMPRESSION: 1) Calcified trileaflet aortic valve probably best suited for a 29
mm Sapien 3 valve. Area is lower limit but generous sinuses and lack
of calcium in STJ, annulus and mitral annulus suggests bigger valve
would possible

2) Suitable coronary height above annulus for deployment

3) No aortic root contraindications to transaortic or tranapical
delivery

4) No TIANGUIS thrombus patient in afib during study

5) Optimum angiographic angle for delivery LAO 19 degrees Cranial 1
degree

Amnon Tiger

EXAM:
OVER-READ INTERPRETATION  CT CHEST

The following report is an over-read performed by radiologist Dr.
over-read does not include interpretation of cardiac or coronary
anatomy or pathology. The coronary CTA interpretation by the
cardiologist is attached.
FINDINGS: AP window lymph node 1 cm in short axis diameter, image 187 series
501. Right paratracheal node 0.9 cm in short axis, image 214 series
501. 4 mm pulmonary nodule observed in the right middle lobe, image
348 series 501.

Bilateral airway thickening. Right upper paratracheal lymph node
cm in short axis, image 17 series 502.
IMPRESSION: 1. Airway thickening is present, suggesting bronchitis or reactive
airways disease.
2. Mild right upper paratracheal lymph nodal enlargement with
borderline enlargement of an AP window lymph node. These may well be
reactive but are technically nonspecific. These were present
06/21/2006 and accordingly are probably benign.
3. 4 mm right middle lobe pulmonary nodule, stable from 06/21/2006
and accordingly considered benign.

## 2014-10-07 MED ORDER — IOHEXOL 350 MG/ML SOLN
70.0000 mL | Freq: Once | INTRAVENOUS | Status: AC | PRN
  Administered 2014-10-07: 70 mL via INTRAVENOUS

## 2014-10-07 MED ORDER — IOHEXOL 350 MG/ML SOLN
80.0000 mL | Freq: Once | INTRAVENOUS | Status: AC | PRN
  Administered 2014-10-07: 80 mL via INTRAVENOUS

## 2014-10-07 MED ORDER — ALBUTEROL SULFATE (2.5 MG/3ML) 0.083% IN NEBU
2.5000 mg | INHALATION_SOLUTION | Freq: Once | RESPIRATORY_TRACT | Status: AC
  Administered 2014-10-07: 2.5 mg via RESPIRATORY_TRACT

## 2014-10-15 ENCOUNTER — Encounter: Payer: Self-pay | Admitting: Surgery

## 2014-10-15 ENCOUNTER — Ambulatory Visit (INDEPENDENT_AMBULATORY_CARE_PROVIDER_SITE_OTHER): Payer: Medicare HMO | Admitting: Surgery

## 2014-10-15 VITALS — BP 88/60 | HR 45 | Resp 20 | Ht 69.0 in | Wt 173.0 lb

## 2014-10-15 DIAGNOSIS — I350 Nonrheumatic aortic (valve) stenosis: Secondary | ICD-10-CM

## 2014-10-16 ENCOUNTER — Other Ambulatory Visit: Payer: Self-pay | Admitting: *Deleted

## 2014-10-16 ENCOUNTER — Encounter: Payer: Self-pay | Admitting: Surgery

## 2014-10-16 NOTE — Progress Notes (Signed)
HPI:  [name] returns today to discuss the results of his cardiac CT and CTA of the chest, abdomen and pelvis done for TAVR workup for severe, symptomatic Stage D aortic stenosis. I felt that he was a high risk surgical candidate due to diffuse, calcific vascular disease with an abdominal aortic stent graft, left gortex femoral popliteal bypass, moderate left carotid stenosis, stenosis of the innominate/right subclavian and left subclavian arteries, diabetes, rheumatoid arthritis on chronic methotrexate, and reduced mobility. He has been feeling about the same as when I saw him on 09/30/2014 with shortness of breath and fatigue on mild exertion. He tried to go out and play some golf this week but was completely exhausted after 9 holes riding a cart and had to stop.   Current Outpatient Prescriptions  Medication Sig Dispense Refill  . aspirin 81 MG tablet Take 81 mg by mouth daily.    . carbidopa-levodopa (SINEMET IR) 25-100 MG per tablet Take 1 tablet by mouth daily.     . cilostazol (PLETAL) 100 MG tablet Take 100 mg by mouth daily.     Marland Kitchen esomeprazole (NEXIUM) 20 MG capsule TAKE ONE CAPSULE BY MOUTH TWICE DAILY    . ferrous fumarate (HEMOCYTE - 106 MG FE) 325 (106 FE) MG TABS tablet Take 1 tablet by mouth daily.    . folic acid (FOLVITE) 1 MG tablet Take 1 mg by mouth daily.    . hydroxychloroquine (PLAQUENIL) 200 MG tablet Take by mouth daily.    Marland Kitchen lisinopril (PRINIVIL,ZESTRIL) 20 MG tablet Take 20 mg by mouth daily.    . methotrexate (RHEUMATREX) 2.5 MG tablet Take 6 tablets weekly.    . rosuvastatin (CRESTOR) 20 MG tablet Take 20 mg by mouth daily.     No current facility-administered medications for this visit.     Physical Exam:  BP 88/60 mmHg  Pulse 45  Resp 20  Ht 5\' 9"  (1.753 m)  Wt 173 lb (78.472 kg)  BMI 25.54 kg/m2  SpO2 98% He looks comfortable Lungs are clear Cardiac exam shows a grade III/VI high pitched crescendo/decrescendo murmur heard best at RSB, no  diastolic murmur. There is no peripheral edema   Diagnostic Tests:   ADDENDUM REPORT: 10/07/2014 15:51  CLINICAL DATA: Aortic stenosis  EXAM: Cardiac TAVR CT  TECHNIQUE: The patient was scanned on a Philips 256 scanner. A 120 kV retrospective scan was triggered in the descending thoracic aorta at 111 HU's. Gantry rotation speed was 270 msecs and collimation was .9 mm. No beta blockade or nitro were given. The 3D data set was reconstructed in 5% intervals of the R-R cycle. Systolic and diastolic phases were analyzed on a dedicated work station using MPR, MIP and VRT modes. The patient received 80 cc of contrast.  FINDINGS: Aortic Valve: Heavily calcified trileaflet aortic valve. No significant calcification of the STJ or annulus. No significant mitral annular calcification or basal septal bulging into the LVOT  Aorta: Mildly dilated aortic root. Mild calcification of the arch and descending thoracic aorta with mild mural aortic debris in the descending thoracic aorta  Sinotubular Junction: 31 mm  Ascending Thoracic Aorta: 4.2 cm  Aortic Arch: 32 mm  Descending Thoracic Aorta: 30 mm  Sinus of Valsalva Measurements:  Non-coronary: 36 mm  Right -coronary: 35.7 mm  Left -coronary: 35.6 mm  Coronary Artery Height above Annulus:  Left Main: 13.8 mm above annulus  Right Coronary: 17.7 mm above annulus  Virtual Basal Annulus Measurements:  Maximum/Minimum Diameter: 27  x 24.5 mm in 30% phase  Perimeter: 85 mm  Area: 539 mm2  Coronary Arteries: Calcified sufficient height above annulus for deployment see cath report  Optimum Fluoroscopic Angle for Delivery: LAO 19 degrees Cranial 1 degree  IMPRESSION: 1) Calcified trileaflet aortic valve probably best suited for a 29 mm Sapien 3 valve. Area is lower limit but generous sinuses and lack of calcium in STJ, annulus and mitral annulus suggests bigger valve would  possible  2) Suitable coronary height above annulus for deployment  3) No aortic root contraindications to transaortic or tranapical delivery  4) No LAA thrombus patient in afib during study  5) Optimum angiographic angle for delivery LAO 19 degrees Cranial 1 degree  Jenkins Rouge   Electronically Signed  By: Jenkins Rouge M.D.  On: 10/07/2014 15:51      Study Result     EXAM: OVER-READ INTERPRETATION CT CHEST  The following report is an over-read performed by radiologist Dr. Laurence Ferrari [facility] on 10/07/2014. This over-read does not include interpretation of cardiac or coronary anatomy or pathology. The coronary CTA interpretation by the cardiologist is attached.  COMPARISON: Multiple exams, including 09/17/2014  FINDINGS: AP window lymph node 1 cm in short axis diameter, image 187 series 501. Right paratracheal node 0.9 cm in short axis, image 214 series 501. 4 mm pulmonary nodule observed in the right middle lobe, image 348 series 501.  Bilateral airway thickening. Right upper paratracheal lymph node 1.1 cm in short axis, image 17 series 502.  IMPRESSION: 1. Airway thickening is present, suggesting bronchitis or reactive airways disease. 2. Mild right upper paratracheal lymph nodal enlargement with borderline enlargement of an AP window lymph node. These may well be reactive but are technically nonspecific. These were present 06/21/2006 and accordingly are probably benign. 3. 4 mm right middle lobe pulmonary nodule, stable from 06/21/2006 and accordingly considered benign.  Electronically Signed: By: Van Clines M.D. On: 10/07/2014 13:37     CLINICAL DATA: 80 year old male with history of severe aortic stenosis. Preprocedural study prior to potential transcatheter aortic valve replacement (TAVR).  EXAM: CT ANGIOGRAPHY CHEST, ABDOMEN AND PELVIS  TECHNIQUE: Multidetector CT imaging through the  chest, abdomen and pelvis was performed using the standard protocol during bolus administration of intravenous contrast. Multiplanar reconstructed images and MIPs were obtained and reviewed to evaluate the vascular anatomy.  CONTRAST: 71mL OMNIPAQUE IOHEXOL 350 MG/ML SOLN  COMPARISON: CTA of the abdomen and pelvis 05/22/2013. CTA of the chest, abdomen and pelvis 06/21/2006.  FINDINGS: CTA CHEST FINDINGS  Mediastinum/Lymph Nodes: Heart size is borderline enlarged with evidence of concentric left ventricular hypertrophy. The apex of the left ventricular cavity is immediately deep to the anterior aspect of the left fifth-sixth intercostal space. There is no significant pericardial fluid, thickening or pericardial calcification. Severely thickened and heavily calcified aortic valve, compatible with the reported clinical history of aortic stenosis. Mild mitral annular calcifications. There is atherosclerosis of the thoracic aorta, the great vessels of the mediastinum and the coronary arteries, including calcified atherosclerotic plaque in the left main, left anterior descending left circumflex and right coronary arteries. In addition, there are moderate stenosis of the subclavian arteries bilaterally, measuring 5.2 x 4.1 mm proximally on the right, and 5.9 x 4.1 mm proximally on the left. No pathologically enlarged mediastinal or hilar lymph nodes. Esophagus is unremarkable in appearance. No axillary lymphadenopathy.  Lungs/Pleura: Patchy areas of subpleural reticulation are noted throughout the periphery of the lungs bilaterally. Mild centrilobular and paraseptal emphysema. Mild diffuse  bronchial wall thickening. No acute consolidative airspace disease. No pleural effusions. No suspicious appearing pulmonary nodules or masses.  Musculoskeletal/Soft Tissues: There are no aggressive appearing lytic or blastic lesions noted in the visualized portions of the skeleton.  CTA  ABDOMEN AND PELVIS FINDINGS  Hepatobiliary: 1.4 cm low-attenuation lesion in segment 5 of the liver is unchanged, and compatible with a small simple cyst. No other suspicious appearing cystic for solid hepatic lesions. No intra or extrahepatic biliary ductal dilatation. Gallbladder is largely collapsed common otherwise unremarkable in appearance.  Pancreas: No pancreatic mass. No pancreatic ductal dilatation. No pancreatic or peripancreatic fluid or inflammatory changes.  Spleen: Unremarkable.  Adrenals/Urinary Tract: Bilateral adrenal glands are normal in appearance. Multiple sub cm low-attenuation lesions in the kidneys bilaterally are too small to definitively characterize, but are favored to represent small cysts. In addition, there are multiple larger well-defined low-attenuation nonenhancing renal lesions bilaterally, largest of which is an exophytic 5.8 cm lesion in the lower pole of the left kidney, compatible with simple cysts. No hydroureteronephrosis. Urinary bladder is normal in appearance.  Stomach/Bowel: Normal appearance of the stomach. No pathologic dilatation of small bowel or colon. Numerous colonic diverticulae are noted, most evident in the region of the sigmoid colon, without surrounding inflammatory changes to suggest an acute diverticulitis at this time.  Vascular/Lymphatic: Vascular findings and measurements pertinent to potential TAVR procedure, as detailed below. Aneurysmal dilatation of the suprarenal abdominal aorta which measures up to 3.7 x 3.2 cm at the level of the origin of the superior mesenteric artery (image 150 of series 401), slightly increased compared to the prior study. In addition, there is a fusiform infrarenal abdominal aortic aneurysm, status post aortobi-iliac stent graft placement. The largest portion of the aneurysm sac measures up to 6.3 x 5.5 cm (image 214 of series 401), which is slightly increased compared to prior study  05/22/2013 (at which point this measured 6.0 x 5.1 cm). Image 189 of series 401 demonstrates a probable blush of contrast material within the aneurysm sac posteriorly on the right side, suggestive of type 2 endoleak. Additionally, in the inferior aspect of the aortic portion of the graft, there appears to be separation of the struts from the graft fabric, best appreciated on image 219 of series 401, where contrast is seen beyond the outer confines of the struts, but well in capsulated within the graft fabric. This aneurysm extends into the common iliac arteries bilaterally, with the right common iliac artery dilated up to 2.7 cm in diameter, and the left common iliac artery dilated up to 2.3 cm in diameter, both similar to the prior examination. 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 There is a large number of embolization coils in the right common iliac artery (unchanged), which causes extensive beam hardening artifact in the right hemipelvis, partially obscuring the distal right common iliac artery vasculature. Single renal arteries bilaterally, with moderate to severe stenosis on the right and moderate stenosis on the left. There is also high-grade stenosis of the proximal right superficial femoral artery. No lymphadenopathy noted in the abdomen or pelvis.  Reproductive: Prostate gland and seminal vesicles are unremarkable in appearance.  Other: Postoperative changes of bilateral inguinal herniorrhaphy with multiple soft tissue anchors from mesh repair bilaterally. No significant volume of ascites. No pneumoperitoneum.  Musculoskeletal: There are no aggressive appearing lytic or blastic lesions noted in the visualized portions of the skeleton.  VASCULAR MEASUREMENTS PERTINENT TO TAVR:  AORTA:  Minimal Aortic Diameter - 19 x  13 mm  Severity of Aortic Calcification - moderate  RIGHT PELVIS:  Right Common Iliac Artery  -  Minimal Diameter - 10.1 x 9.6 mm in the visualized portion of the vessel (distal right common iliac artery is completely obscured by beam hardening artifact from the a large amount of embolization coils in the proximal right internal iliac artery).  Tortuosity - mild  Calcification - stent graft present  Right External Iliac Artery -  Minimal Diameter - 6.4 x 5.7 mm  Tortuosity - moderate to severe  Calcification - moderate (stent graft present proximally)  Right Common Femoral Artery -  Minimal Diameter - 5.9 x 4.9 mm  Tortuosity - mild  Calcification - moderate  LEFT PELVIS:  Left Common Iliac Artery -  Minimal Diameter - 10.9 x 8.7 mm  Tortuosity - mild  Calcification - stent graft present  Left External Iliac Artery -  Minimal Diameter - 5.6 x 5.4 mm  Tortuosity - moderate  Calcification - moderate (stent graft present proximally)  Left Common Femoral Artery -  Minimal Diameter - 5.4 x 4.8 mm  Tortuosity - mild  Calcification - moderate  Review of the MIP images confirms the above findings.  IMPRESSION: 1. Vascular findings and measurements pertinent to potential TAVR procedure, as detailed above. This patient does not appear to have suitable pelvic arterial access. As well, there is moderate to severe stenosis of the proximal subclavian arteries bilaterally. 2. There are both suprarenal and infrarenal abdominal aortic aneurysms, in addition to aneurysmal dilatation of the common iliac arteries bilaterally. Patient is status post aortobi-iliac stent graft placement, and there continues to be slight progressive enlargement of the excluded aneurysm sac, with evidence of potential type 2 endoleak (likely from a lumbar artery) on today's examination, as discussed above. 3. Colonic diverticulosis without findings to suggest acute diverticulitis at this time. 4. Additional incidental findings, as  above.   Electronically Signed  By: Vinnie Langton M.D.  On: 10/14/2014 15:04    Ref Range 9d ago    FVC-Pre L 3.03   FVC-%Pred-Pre % 79   FVC-Post L 3.16   FVC-%Pred-Post % 83   FVC-%Change-Post % 4   FEV1-Pre L 2.11   FEV1-%Pred-Pre % 77   FEV1-Post L 2.30   FEV1-%Pred-Post % 84   FEV1-%Change-Post % 8   FEV6-Pre L 2.99   FEV6-%Pred-Pre % 84   FEV6-Post L 3.12   FEV6-%Pred-Post % 88   FEV6-%Change-Post % 4   Pre FEV1/FVC ratio % 70   FEV1FVC-%Pred-Pre % 96   Post FEV1/FVC ratio % 73   FEV1FVC-%Change-Post % 4   Pre FEV6/FVC Ratio % 99   FEV6FVC-%Pred-Pre % 106   Post FEV6/FVC ratio % 99   FEV6FVC-%Pred-Post % 106   FEV6FVC-%Change-Post % 0   FEF 25-75 Pre L/sec 1.30   FEF2575-%Pred-Pre % 68   FEF 25-75 Post L/sec 1.86   FEF2575-%Pred-Post % 97   FEF2575-%Change-Post % 43   RV L 2.48   RV % pred % 99   TLC L 5.64   TLC % pred % 84   DLCO unc ml/min/mmHg 15.00   DLCO unc % pred % 50   DL/VA ml/min/mmHg/L 3.58   DL/VA % pred % 80   Resulting Agency [facility]       Specimen Collected: 10/07/14 2:09 PM Last Resulted: 10/07/14 3:12 PM         Impression:  I have personally reviewed his CT scans. His cardiac CT shows that he would be an  acceptable candidate for a Sapien 3 or XT valve, most likely a 29 mm. His abdominal and pelvic CTA show that he has significant aortoiliac disease with an aortobi-iliac stent graft and there continues to be slight progressive enlargement of the excluded aneurysm sac with evidence of potential type 2 endoleak. The aneurysm sac is now 6.3 x 5.5 cm compared to 6.0 x 5.1 cm in Jan 2015 when he had his coiling procedure for endoleak by Dr. Lucky Cowboy. I think delivering a TAVR valve via the femoral arteries would be difficult and risky due to his stent graft, the extent of disease and the left femoral to popliteal gortex graft. His ascending aorta appears adequate for a transaortic approach. I  discussed the scan results with the patient, his wife and daughter and answered their questions.   I discussed transcatheter aortic valve replacement, what types of management strategies would be attempted intraoperatively in the event of life-threatening complications, including whether or not the patient would be considered a candidate for the use of cardiopulmonary bypass and/or conversion to open sternotomy for attempted surgical intervention.  The patient has been advised of a variety of complications that might develop including but not limited to risks of death, stroke, paravalvular leak, aortic dissection or other major vascular complications, aortic annulus rupture, device embolization, cardiac rupture or perforation, mitral regurgitation, acute myocardial infarction, arrhythmia, heart block or bradycardia requiring permanent pacemaker placement, congestive heart failure, respiratory failure, renal failure, pneumonia, infection, other late complications related to structural valve deterioration or migration, or other complications that might ultimately cause a temporary or permanent loss of functional independence or other long term morbidity.  The patient provides full informed consent for the procedure as described and all questions were answered. He would like to continue with further evaluation.   Plan:  The patient will be scheduled for a second surgical opinion with Dr. Roxy Manns and consultation with one of the structural heart valve cardiologists on the team before making a final decision about proceeding with TAVR.   Gaye Pollack, MD [facility] [phone]

## 2014-10-17 ENCOUNTER — Other Ambulatory Visit: Payer: Self-pay | Admitting: *Deleted

## 2014-10-17 DIAGNOSIS — I350 Nonrheumatic aortic (valve) stenosis: Secondary | ICD-10-CM

## 2014-10-29 ENCOUNTER — Ambulatory Visit: Payer: Medicare HMO | Attending: Surgery | Admitting: Physical Therapy

## 2014-10-29 DIAGNOSIS — R262 Difficulty in walking, not elsewhere classified: Secondary | ICD-10-CM

## 2014-10-29 DIAGNOSIS — I350 Nonrheumatic aortic (valve) stenosis: Secondary | ICD-10-CM | POA: Diagnosis not present

## 2014-10-29 NOTE — Therapy (Signed)
[place], [place], [address] Phone: [phone]   Fax:  [phone]  Physical Therapy Evaluation  Patient Details  Name: Chase Bell. MRN: [id number] Date of Birth: 1934/05/19 Referring Provider:  Gaye Pollack, MD  Encounter Date: 10/29/2014      PT End of Session - 10/29/14 1410    Visit Number 1   PT Start Time 1410   PT Stop Time 1450   PT Time Calculation (min) 40 min      Past Medical History  Diagnosis Date  . PVD (peripheral vascular disease)     with abdominal aortic aneurysm and claudication, s/p repair.  Marland Kitchen Hyperlipidemia   . Hypertension   . Valvular heart disease     moderate to severe aortic stenosis  . AAA (abdominal aortic aneurysm)     s/p repair/stent placement  . Heart murmur   . Carotid artery occlusion 2014  . Renal artery stenosis   . Diabetes mellitus without complication   . Basal cell carcinoma   . Kidney stones   . Bilateral cataracts   . Nephrolithiasis   . Tremor   . History of alcoholism   . History of seizures   . Anemia   . Osteoarthritis   . Rheumatoid arthritis   . Anemia   . Coronary artery disease     Past Surgical History  Procedure Laterality Date  . Femoral bypass      left leg  . Cataract extraction, bilateral    . Hernia repair  1996  . Abdominal aortic aneurysm repair  08/29/2007    stent graft repair  . Cystoscopy    . Carotid endarterectomy  2014    right   . Carotid endarterectomy    . Abdominal aortic aneurysm repair    . Cardiac catheterization N/A 09/18/2014    Procedure: Left Heart Cath;  Surgeon: Minna Merritts, MD;  Location: [facility];  Service: Cardiovascular;  Laterality: N/A;    There were no vitals filed for this visit.  Visit Diagnosis:  Difficulty walking - Plan: PT plan of care cert/re-cert  Severe aortic stenosis - Plan: PT plan of care cert/re-cert      Subjective Assessment -  10/29/14 1414    Subjective last fall while doing yardwork on a hot day - noticed dizziness; repeated episode on New Year's Day while preparing dinner; had to give up golf about 3-4 weeks ago due to weakness, fatigue, and mild SOB   Patient Stated Goals return to golf   Currently in Pain? No/denies            [facility] PT Assessment - 10/29/14 0001    Assessment   Medical Diagnosis severe aortic stenosis   Onset Date/Surgical Date 09/28/14  approximate   Precautions   Precaution Comments no exertion   Restrictions   Weight Bearing Restrictions No   Balance Screen   Has the patient fallen in the past 6 months No   Has the patient had a decrease in activity level because of a fear of falling?  No   Is the patient reluctant to leave their home because of a fear of falling?  No   Home Social worker Private residence   Living Arrangements Spouse/significant other   Home Access Stairs to enter   Entrance Stairs-Number of Steps 2   Entrance Stairs-Rails Right;Left;Cannot reach both   [place] Two level;Able to live on main level  with bedroom/bathroom   Prior Function   Level of Independence Independent  playing golf   Posture/Postural Control   Posture/Postural Control Postural limitations   Postural Limitations Forward head  mild   ROM / Strength   AROM / PROM / Strength AROM;Strength   AROM   Overall AROM  Within functional limits for tasks performed   Strength   Overall Strength Comments grossly 5/5   Strength Assessment Site Hand   Right/Left hand Right;Left   Right Hand Grip (lbs) 80   Left Hand Grip (lbs) 80   Ambulation/Gait   Ambulation/Gait Yes   Ambulation/Gait Assistance 7: Independent   Gait Comments No significant deviations noted.          [facility] Pre-Surgical Assessment - 27-Nov-2014 0001    5 Meter Walk Test- trial 1 4.6 sec   5 Meter Walk Test- trial 2 5 sec.    5 Meter Walk Test- trial 3 4.6 sec.  </= 6 seconds WNL   5 meter walk  test average 4.73 sec   Timed Up & Go Test trial  10.9 sec.   Comments </= 12 seconds WNL   4 Stage Balance Test tolerated for:  10 sec.   4 Stage Balance Test Position 4   comment not indicative of high fall risk   Sit To Stand Test- trial 1 15 sec.   Comment </= 12.6 seconds WNL   ADL/IADL Independent with: Bathing;Dressing;Meal prep;Finances;Yard work   ADL/IADL Therapist, sports Index Vulnerable   6 Minute Walk- Baseline yes   BP (mmHg) 170/80 mmHg   HR (bpm) 66  manual - radial pulse   02 Sat (%RA) 97 %   Modified Borg Scale for Dyspnea 0- Nothing at all   Perceived Rate of Exertion (Borg) 6-   6 Minute Walk Post Test yes   BP (mmHg) 176/82 mmHg   HR (bpm) 83   02 Sat (%RA) 87 %   Modified Borg Scale for Dyspnea 3- Moderate shortness of breath or breathing difficulty   Perceived Rate of Exertion (Borg) 13- Somewhat hard   Aerobic Endurance Distance Walked 1089   Endurance additional comments walked continuously for 5:45 and stopped due to O2 down to 87%.                                     Plan - 2014-11-27 2030    Clinical Impression Statement Pt is a 80 yo male presenting to OP PT for evaluation prior to TAVR surgery due to severe aortic stenosis. Pt reports a recent decline in mobility and had to discontinue golf about a month ago due to fatigue, weakness, and SOB. Pt presents with ROM/strength WNL. Pt's balance was good and he scored WNL on Timed up and go, 4 stage balance test, and 5 meter walk. Pt was slightly below age/gender related norms on 5 times sit to stand. Pt ambulated 1089 feet in 6 minute walk test but had to stop at 5:45 due to oxygen desaturation to 87% with about 1 minute seated rest required to recover. SOB rated moderate and appears to be primary limitation in patient's overall mobility.    PT Frequency One time visit          G-Codes - 11/27/2014 09/12/2035    Functional Assessment Tool Used 6 minute walk 1089, oxygen to 87%   Functional  Limitation Mobility: Walking and moving around   Mobility: Walking and  Moving Around Current Status [phone]) At least 20 percent but less than 40 percent impaired, limited or restricted   Mobility: Walking and Moving Around Goal Status [phone]) At least 20 percent but less than 40 percent impaired, limited or restricted   Mobility: Walking and Moving Around Discharge Status [phone]) At least 20 percent but less than 40 percent impaired, limited or restricted       Problem List Patient Active Problem List   Diagnosis Date Noted  . Aortic valve stenosis, severe 05/28/2014  . Near syncope 05/19/2014  . HTN (hypertension) 10/30/2013  . S/P AAA (abdominal aortic aneurysm) repair 10/30/2013  . AAA (abdominal aortic aneurysm) 10/30/2013  . Carotid stenosis 10/30/2013  . History of CEA (carotid endarterectomy) 10/30/2013  . Hyperlipidemia 10/30/2013  . DM (diabetes mellitus) type II controlled peripheral vascular disorder 10/30/2013  . Smoking history 10/30/2013    [place], PT 10/29/2014, 8:39 PM  [facility] [address] [place], [place], [address] Phone: [phone]   Fax:  [phone]

## 2014-11-10 ENCOUNTER — Ambulatory Visit (INDEPENDENT_AMBULATORY_CARE_PROVIDER_SITE_OTHER): Payer: Medicare HMO | Admitting: Cardiovascular Disease

## 2014-11-10 ENCOUNTER — Encounter: Payer: Self-pay | Admitting: Cardiovascular Disease

## 2014-11-10 VITALS — BP 140/60 | HR 43 | Ht 69.0 in | Wt 165.8 lb

## 2014-11-10 DIAGNOSIS — I350 Nonrheumatic aortic (valve) stenosis: Secondary | ICD-10-CM | POA: Diagnosis not present

## 2014-11-10 NOTE — Progress Notes (Signed)
Cardiology Office Note   Date:  11/10/2014   ID:  Chase Manners Sr., DOB 08-20-1934, MRN [id number]  PCP:  Juluis Pitch, MD  Cardiologist:  Sherren Mocha, MD    Chief Complaint  Patient presents with  . Shortness of Breath    History of Present Illness: Chase Kourosh Jablonsky Sr. is a 80 y.o. male who presents for evaluation of severe symptomatic aortic stenosis.   He's had a heart murmur for many years and has been followed closely. He was largely asymptomatic but he developed episodes of lightheadedness and near-syncope around the first of this year. During one of these episodes he was noted to have profound hypotension. He's had no further 'spells' of lightheadedness or syncope. At the time of his evaluation by Dr Rockey Situ, he again was noted to have a prominent murmur and a follow-up echo confirmed severe aortic stenosis with a mean gradient of 45 mmHg.   He also has developed exercise intolerance. Notes the last time he went out to play golf he felt very weak after 9 holes and had to quit. He hasn't exerted much in recent months. He does admit to shortness of breath with activity. He denies chest pain or pressure.   Cardiac catheterization by Dr Rockey Situ 09/18/2014 showed no obstructive CAD - findings as outlined below:  Nondominant right coronary system.  Ostial RCA not well visualized, unable to exclude stenosis (small to moderate sized vessel)  Dist LAD lesion, 40% stenosed.  Otherwise mild lumenal irregularies  Unable to cross the aortic valve secondary to severe stenosis.  Challenging case secondary to access in the right groin (significant scar tissue) and difficulty extending through the AAA endograft.  He has subsequently undergone cardiac surgical evaluation by Dr. Cyndia Bent and after extensive evaluation, TAVR has been recommended.  Past Medical History  Diagnosis Date  . PVD (peripheral vascular disease)     with abdominal aortic aneurysm and  claudication, s/p repair.  Marland Kitchen Hyperlipidemia   . Hypertension   . Valvular heart disease     moderate to severe aortic stenosis  . AAA (abdominal aortic aneurysm)     s/p repair/stent placement  . Heart murmur   . Carotid artery occlusion 2014  . Renal artery stenosis   . Diabetes mellitus without complication   . Basal cell carcinoma   . Kidney stones   . Bilateral cataracts   . Nephrolithiasis   . Tremor   . History of alcoholism   . History of seizures   . Anemia   . Osteoarthritis   . Rheumatoid arthritis   . Anemia   . Coronary artery disease     Past Surgical History  Procedure Laterality Date  . Femoral bypass      left leg  . Cataract extraction, bilateral    . Hernia repair  1996  . Abdominal aortic aneurysm repair  08/29/2007    stent graft repair  . Cystoscopy    . Carotid endarterectomy  2014    right   . Carotid endarterectomy    . Abdominal aortic aneurysm repair    . Cardiac catheterization N/A 09/18/2014    Procedure: Left Heart Cath;  Surgeon: Minna Merritts, MD;  Location: [facility];  Service: Cardiovascular;  Laterality: N/A;    Current Outpatient Prescriptions  Medication Sig Dispense Refill  . aspirin 81 MG tablet Take 81 mg by mouth daily.    . carbidopa-levodopa (SINEMET IR) 25-100 MG per tablet Take 1 tablet by mouth  daily.     . cilostazol (PLETAL) 100 MG tablet Take 100 mg by mouth daily.     Marland Kitchen doxycycline (VIBRAMYCIN) 100 MG capsule Take 100 mg by mouth 2 (two) times daily.    Marland Kitchen esomeprazole (NEXIUM) 20 MG capsule TAKE ONE CAPSULE BY MOUTH TWICE DAILY    . ferrous fumarate (HEMOCYTE - 106 MG FE) 325 (106 FE) MG TABS tablet Take 1 tablet by mouth daily.    . folic acid (FOLVITE) 1 MG tablet Take 1 mg by mouth daily.    Marland Kitchen guaiFENesin-codeine (ROBITUSSIN AC) 100-10 MG/5ML syrup Take 5 mLs by mouth every 4 (four) hours as needed (cough).     . hydroxychloroquine (PLAQUENIL) 200 MG tablet Take 200 mg by mouth daily.     Marland Kitchen lisinopril  (PRINIVIL,ZESTRIL) 20 MG tablet Take 20 mg by mouth daily.    . methotrexate (RHEUMATREX) 2.5 MG tablet Take 15 mg by mouth every 7 (seven) days.    . rosuvastatin (CRESTOR) 20 MG tablet Take 10 mg by mouth daily.      No current facility-administered medications for this visit.    Allergies:   Simvastatin   Social History:  The patient  reports that he quit smoking about 9 years ago. His smoking use included Cigarettes. He has a 50 pack-year smoking history. His smokeless tobacco use includes Chew. He reports that he does not drink alcohol or use illicit drugs.   Family History:  The patient's family history includes Heart disease in his brother.    ROS:  Please see the history of present illness.  Otherwise, review of systems is positive for shortness of breath, weakness.  All other systems are reviewed and negative.    PHYSICAL EXAM: VS:  BP 140/60 mmHg  Pulse 43  Ht 5\' 9"  (1.753 m)  Wt 165 lb 12.8 oz (75.206 kg)  BMI 24.47 kg/m2 , BMI Body mass index is 24.47 kg/(m^2). GEN: Well nourished, well developed, in no acute distress HEENT: normal Neck: no JVD, no masses. Bilateral carotid bruits Cardiac: irregular with grade 4/6 late-peaking systolic murmur with absent A2               Respiratory:  clear to auscultation bilaterally, normal work of breathing GI: soft, nontender, nondistended, + BS MS: no deformity or atrophy Ext: no pretibial edema Skin: warm and dry, no rash Neuro:  Strength and sensation are intact Psych: euthymic mood, full affect  EKG:  EKG is not ordered today.  Recent Labs: 09/17/2014: BUN 19; Creatinine, Ser 1.27*; Hemoglobin 11.7*; Platelets 188; Potassium 5.0; Sodium 142   Lipid Panel  No results found for: CHOL, TRIG, HDL, CHOLHDL, VLDL, LDLCALC, LDLDIRECT    Wt Readings from Last 3 Encounters:  11/10/14 165 lb 12.8 oz (75.206 kg)  10/15/14 173 lb (78.472 kg)  09/30/14 173 lb (78.472 kg)     Cardiac Studies Reviewed: 2D Echo 05/23/2014: Left  ventricle: The cavity size was normal. There was mild concentric hypertrophy. Systolic function was normal. Wall motion was normal; there were no regional wall motion abnormalities. Doppler parameters are consistent with abnormal left ventricular relaxation (grade 1 diastolic dysfunction).  ------------------------------------------------------------------- Aortic valve: Poorly visualized. Trileaflet; normal thickness, severely calcified leaflets. Mobility was not restricted. Doppler: Transvalvular velocity was increased. There was severe stenosis. There was mild to moderate regurgitation.  VTI ratio of LVOT to aortic valve: 0.21. Valve area (VTI): 0.82 cm^2. Indexed valve area (VTI): 0.41 cm^2/m^2. Valve area (Vmax): 0.81 cm^2. Indexed valve area (Vmax): 0.41  cm^2/m^2. Mean velocity ratio of LVOT to aortic valve: 0.23. Valve area (Vmean): 0.86 cm^2. Indexed valve area (Vmean): 0.43 cm^2/m^2.  Mean gradient (S): 45 mm Hg. Peak gradient (S): 79 mm Hg.  ------------------------------------------------------------------- Aorta: Aortic root: The aortic root was mildly dilated.  ------------------------------------------------------------------- Mitral valve:  Calcified annulus. Mobility was not restricted. Doppler: Transvalvular velocity was within the normal range. There was no evidence for stenosis. There was no regurgitation.  Valve area by pressure half-time: 1.76 cm^2. Indexed valve area by pressure half-time: 0.88 cm^2/m^2.  ------------------------------------------------------------------- Left atrium: The atrium was moderately dilated.  ------------------------------------------------------------------- Right ventricle: The cavity size was normal. Wall thickness was normal. Systolic function was normal.  ------------------------------------------------------------------- Pulmonic valve:  Structurally normal valve.  Cusp separation was normal. Doppler:  Transvalvular velocity was within the normal range. There was no evidence for stenosis. There was no regurgitation.  ------------------------------------------------------------------- Tricuspid valve:  Structurally normal valve.  Doppler: Transvalvular velocity was within the normal range. There was no regurgitation.  ------------------------------------------------------------------- Pulmonary artery:  The main pulmonary artery was normal-sized. Systolic pressure was within the normal range.  ------------------------------------------------------------------- Right atrium: The atrium was normal in size.  ------------------------------------------------------------------- Pericardium: There was no pericardial effusion.  ------------------------------------------------------------------- Systemic veins: Inferior vena cava: The vessel was normal in size.  CTA Heart: FINDINGS: Aortic Valve: Heavily calcified trileaflet aortic valve. No significant calcification of the STJ or annulus. No significant mitral annular calcification or basal septal bulging into the LVOT  Aorta: Mildly dilated aortic root. Mild calcification of the arch and descending thoracic aorta with mild mural aortic debris in the descending thoracic aorta  Sinotubular Junction: 31 mm  Ascending Thoracic Aorta: 4.2 cm  Aortic Arch: 32 mm  Descending Thoracic Aorta: 30 mm  Sinus of Valsalva Measurements:  Non-coronary: 36 mm  Right -coronary: 35.7 mm  Left -coronary: 35.6 mm  Coronary Artery Height above Annulus:  Left Main: 13.8 mm above annulus  Right Coronary: 17.7 mm above annulus  Virtual Basal Annulus Measurements:  Maximum/Minimum Diameter: 27 x 24.5 mm in 30% phase  Perimeter: 85 mm  Area: 539 mm2  Coronary Arteries: Calcified sufficient height above annulus for deployment see cath report  Optimum Fluoroscopic Angle for Delivery: LAO 19 degrees  Cranial 1 degree  IMPRESSION: 1) Calcified trileaflet aortic valve probably best suited for a 29 mm Sapien 3 valve. Area is lower limit but generous sinuses and lack of calcium in STJ, annulus and mitral annulus suggests bigger valve would possible  2) Suitable coronary height above annulus for deployment  3) No aortic root contraindications to transaortic or tranapical delivery  4) No LAA thrombus patient in afib during study  5) Optimum angiographic angle for delivery LAO 19 degrees Cranial 1 degree  Peter Nishan  CTA Abdomen/Pelvis: VASCULAR MEASUREMENTS PERTINENT TO TAVR:  AORTA:  Minimal Aortic Diameter - 19 x 13 mm  Severity of Aortic Calcification - moderate  RIGHT PELVIS:  Right Common Iliac Artery -  Minimal Diameter - 10.1 x 9.6 mm in the visualized portion of the vessel (distal right common iliac artery is completely obscured by beam hardening artifact from the a large amount of embolization coils in the proximal right internal iliac artery).  Tortuosity - mild  Calcification - stent graft present  Right External Iliac Artery -  Minimal Diameter - 6.4 x 5.7 mm  Tortuosity - moderate to severe  Calcification - moderate (stent graft present proximally)  Right Common Femoral Artery -  Minimal Diameter - 5.9 x 4.9 mm  Tortuosity -  mild  Calcification - moderate  LEFT PELVIS:  Left Common Iliac Artery -  Minimal Diameter - 10.9 x 8.7 mm  Tortuosity - mild  Calcification - stent graft present  Left External Iliac Artery -  Minimal Diameter - 5.6 x 5.4 mm  Tortuosity - moderate  Calcification - moderate (stent graft present proximally)  Left Common Femoral Artery -  Minimal Diameter - 5.4 x 4.8 mm  Tortuosity - mild  Calcification - moderate  Review of the MIP images confirms the above findings.  IMPRESSION: 1. Vascular findings and measurements pertinent to potential TAVR procedure, as  detailed above. This patient does not appear to have suitable pelvic arterial access. As well, there is moderate to severe stenosis of the proximal subclavian arteries bilaterally. 2. There are both suprarenal and infrarenal abdominal aortic aneurysms, in addition to aneurysmal dilatation of the common iliac arteries bilaterally. Patient is status post aortobi-iliac stent graft placement, and there continues to be slight progressive enlargement of the excluded aneurysm sac, with evidence of potential type 2 endoleak (likely from a lumbar artery) on today's examination, as discussed above. 3. Colonic diverticulosis without findings to suggest acute diverticulitis at this time. 4. Additional incidental findings, as above.  STS Calculator: Procedure: AV Replacement  Risk of Mortality: 2.168%  Morbidity or Mortality: 16.584%  Long Length of Stay: 5.461%  Short Length of Stay: 33.983%  Permanent Stroke: 2.272%  Prolonged Ventilation: 7.364%  DSW Infection: 0.245%  Renal Failure: 5.194%  Reoperation: 8.92%   ASSESSMENT AND PLAN: 80 yo male with Stage D severe symptomatic aortic stenosis. I agree that AVR is indicated for symptom control and mortality benefit. I have reviewed the natural history of aortic stenosis with the patient and  his family members who are present today. We have discussed the limitations of medical therapy and the poor prognosis associated with symptomatic aortic stenosis. We have also reviewed potential treatment options, including palliative medical therapy, conventional surgical aortic valve replacement, and transcatheter aortic valve replacement. We discussed treatment options in the context of this patient's specific comorbid medical conditions. Comorbid medical conditions include severe vascular disease with hx of EVAR, hx fem-pop bypass, and rheumatoid arthritis on chronic methotrexate now for > 10 years. These comorbidities are not well-captured in the STS  risk calculator and I agree that TAVR is a reasonable treatment alternative as I suspect he would have a difficult recovery from conventional AVR.   I have reviewed his echo, cath, and CT images. I agree that if we proceed with TAVR, he would be best treated with a 29 mm THV via direct aortic approach. He has significant aorto-iliac occlusive disease and I do not think his stent graft would accommodate the e-sheath required for transfemoral access.   Following the decision to proceed with transcatheter aortic valve replacement, a discussion has been held regarding what types of management strategies would be attempted intraoperatively in the event of life-threatening complications, including whether or not the patient would be considered a candidate for the use of cardiopulmonary bypass and/or conversion to open sternotomy for attempted surgical intervention.  The patient has been advised of a variety of complications that might develop including but not limited to risks of death, stroke, paravalvular leak, aortic dissection or other major vascular complications, aortic annulus rupture, device embolization, cardiac rupture or perforation, mitral regurgitation, acute myocardial infarction, arrhythmia, heart block or bradycardia requiring permanent pacemaker placement, congestive heart failure, respiratory failure, renal failure, pneumonia, infection, other late complications related to structural valve deterioration  or migration, or other complications that might ultimately cause a temporary or permanent loss of functional independence or other long term morbidity.  The patient provides full informed consent for the procedure as described and all questions were answered.   Current medicines are reviewed with the patient today.  The patient does not have concerns regarding medicines.  Labs/ tests ordered today include:  No orders of the defined types were placed in this encounter.    Disposition:   Second  Surgical opinion from Dr Roxy Manns later this week, then preop labs/XRay followed by TAVR tentatively scheduled July 5th.  Deatra James, MD  11/10/2014 2:48 PM    [address], [place], [place]  [id number] Phone: [phone]; Fax: [phone]

## 2014-11-10 NOTE — Patient Instructions (Signed)
Medication Instructions:  Your physician recommends that you continue on your current medications as directed. Please refer to the Current Medication list given to you today.  Labwork: No new orders.   Testing/Procedures: No new orders.   Follow-Up: Please keep your scheduled follow-up appointment with Dr Roxy Manns.   Any Other Special Instructions Will Be Listed Below (If Applicable).

## 2014-11-12 ENCOUNTER — Encounter: Payer: Medicare HMO | Admitting: Thoracic Surgery (Cardiothoracic Vascular Surgery)

## 2014-11-12 ENCOUNTER — Institutional Professional Consult (permissible substitution): Payer: Medicare HMO | Admitting: Cardiovascular Disease

## 2014-11-13 ENCOUNTER — Ambulatory Visit (HOSPITAL_COMMUNITY)
Admission: RE | Admit: 2014-11-13 | Discharge: 2014-11-13 | Disposition: A | Discharge status: Home / Self Care | Payer: Medicare HMO | Source: Ambulatory Visit | Attending: Surgery | Admitting: Surgery

## 2014-11-13 ENCOUNTER — Institutional Professional Consult (permissible substitution) (INDEPENDENT_AMBULATORY_CARE_PROVIDER_SITE_OTHER): Payer: Medicare HMO | Admitting: Thoracic Surgery (Cardiothoracic Vascular Surgery)

## 2014-11-13 ENCOUNTER — Encounter (HOSPITAL_COMMUNITY)
Admission: RE | Admit: 2014-11-13 | Discharge: 2014-11-13 | Disposition: A | Discharge status: Home / Self Care | Payer: Medicare HMO | Source: Ambulatory Visit | Attending: Surgery | Admitting: Surgery

## 2014-11-13 ENCOUNTER — Encounter: Payer: Self-pay | Admitting: Thoracic Surgery (Cardiothoracic Vascular Surgery)

## 2014-11-13 ENCOUNTER — Other Ambulatory Visit (HOSPITAL_COMMUNITY): Payer: Medicare HMO

## 2014-11-13 ENCOUNTER — Encounter (HOSPITAL_COMMUNITY): Payer: Self-pay

## 2014-11-13 VITALS — BP 161/58 | HR 82 | Temp 97.8°F | Resp 20 | Ht 69.0 in | Wt 164.0 lb

## 2014-11-13 VITALS — BP 124/80 | HR 42 | Resp 20 | Ht 69.0 in | Wt 164.0 lb

## 2014-11-13 DIAGNOSIS — Z01818 Encounter for other preprocedural examination: Secondary | ICD-10-CM | POA: Insufficient documentation

## 2014-11-13 DIAGNOSIS — Z01812 Encounter for preprocedural laboratory examination: Secondary | ICD-10-CM | POA: Diagnosis not present

## 2014-11-13 DIAGNOSIS — I209 Angina pectoris, unspecified: Secondary | ICD-10-CM

## 2014-11-13 DIAGNOSIS — I350 Nonrheumatic aortic (valve) stenosis: Secondary | ICD-10-CM

## 2014-11-13 DIAGNOSIS — Z01810 Encounter for preprocedural cardiovascular examination: Secondary | ICD-10-CM | POA: Diagnosis not present

## 2014-11-13 HISTORY — DX: Gastric ulcer, unspecified as acute or chronic, without hemorrhage or perforation: K25.9

## 2014-11-13 HISTORY — DX: Personal history of other medical treatment: Z92.89

## 2014-11-13 HISTORY — DX: Unspecified convulsions: R56.9

## 2014-11-13 HISTORY — DX: Reserved for inherently not codable concepts without codable children: IMO0001

## 2014-11-13 HISTORY — DX: Personal history of urinary calculi: Z87.442

## 2014-11-13 LAB — SURGICAL PCR SCREEN
MRSA, PCR: NEGATIVE
Staphylococcus aureus: NEGATIVE

## 2014-11-13 LAB — CBC
HEMATOCRIT: 38.4 % — AB (ref 39.0–52.0)
Hemoglobin: 12.2 g/dL — ABNORMAL LOW (ref 13.0–17.0)
MCH: 27.1 pg (ref 26.0–34.0)
MCHC: 31.8 g/dL (ref 30.0–36.0)
MCV: 85.3 fL (ref 78.0–100.0)
PLATELETS: 177 10*3/uL (ref 150–400)
RBC: 4.5 MIL/uL (ref 4.22–5.81)
RDW: 17.3 % — ABNORMAL HIGH (ref 11.5–15.5)
WBC: 11 10*3/uL — AB (ref 4.0–10.5)

## 2014-11-13 LAB — BLOOD GAS, ARTERIAL
Acid-base deficit: 1.9 mmol/L (ref 0.0–2.0)
Bicarbonate: 21.8 mEq/L (ref 20.0–24.0)
DRAWN BY: 428831
FIO2: 0.21 %
O2 Saturation: 96.6 %
PH ART: 7.43 (ref 7.350–7.450)
Patient temperature: 98.6
TCO2: 22.8 mmol/L (ref 0–100)
pCO2 arterial: 33.4 mmHg — ABNORMAL LOW (ref 35.0–45.0)
pO2, Arterial: 87.5 mmHg (ref 80.0–100.0)

## 2014-11-13 LAB — COMPREHENSIVE METABOLIC PANEL
ALT: 5 U/L — ABNORMAL LOW (ref 17–63)
AST: 16 U/L (ref 15–41)
Albumin: 3.5 g/dL (ref 3.5–5.0)
Alkaline Phosphatase: 61 U/L (ref 38–126)
Anion gap: 10 (ref 5–15)
BUN: 18 mg/dL (ref 6–20)
CALCIUM: 8.9 mg/dL (ref 8.9–10.3)
CHLORIDE: 108 mmol/L (ref 101–111)
CO2: 21 mmol/L — ABNORMAL LOW (ref 22–32)
Creatinine, Ser: 1.13 mg/dL (ref 0.61–1.24)
GFR calc non Af Amer: >60 mL/min (ref >60)
GLUCOSE: 103 mg/dL — AB (ref 65–99)
Potassium: 4.4 mmol/L (ref 3.5–5.1)
SODIUM: 139 mmol/L (ref 135–145)
Total Bilirubin: 0.6 mg/dL (ref 0.3–1.2)
Total Protein: 6.3 g/dL — ABNORMAL LOW (ref 6.5–8.1)

## 2014-11-13 LAB — URINALYSIS, ROUTINE W REFLEX MICROSCOPIC
Glucose, UA: NEGATIVE mg/dL
HGB URINE DIPSTICK: NEGATIVE
KETONES UR: 15 mg/dL — AB
LEUKOCYTES UA: NEGATIVE
Nitrite: NEGATIVE
Protein, ur: NEGATIVE mg/dL
Specific Gravity, Urine: 1.019 (ref 1.005–1.030)
UROBILINOGEN UA: 0.2 mg/dL (ref 0.0–1.0)
pH: 5.5 (ref 5.0–8.0)

## 2014-11-13 LAB — APTT: aPTT: 27 seconds (ref 24–37)

## 2014-11-13 LAB — PROTIME-INR
INR: 1.13 (ref 0.00–1.49)
PROTHROMBIN TIME: 14.6 s (ref 11.6–15.2)

## 2014-11-13 MED ORDER — ASPIRIN 81 MG PO CHEW: 81.0000 mg | CHEWABLE_TABLET | ORAL | Status: DC

## 2014-11-13 NOTE — Progress Notes (Signed)
[facility]    CARDIOTHORACIC SURGERY CONSULTATION REPORT  Referring Provider is Gollan, Kathlene November, MD PCP is Juluis Pitch, MD  Chief Complaint  Patient presents with  . Aortic Stenosis    2nd opinion    HPI:  Patient is a 81 year old male with history of aortic stenosis, hypertension, hyperlipidemia, cerebrovascular disease, peripheral vascular disease, renal artery stenosis, rheumatoid arthritis, and chronic tremors who has been referred for a second surgical opinion to discuss treatment options for management of severe symptomatic aortic stenosis. The patient has had known history of heart murmur dating back several years for which she has been followed by Dr. Rockey Situ with serial echocardiograms. Previous echocardiograms had suggested the presence of moderate aortic stenosis. Last summer the patient suffered a near syncopal episode while he was preparing his lawnmower. Initially he attributed this to the heat. This past 8 years the patient suffered another near syncopal episode while he was helping his wife cook a meal for the family.  He underwent a follow-up echocardiogram 05/23/2014 that demonstrated the presence of severe aortic stenosis. Left ventricular systolic function was preserved.  Since then he has developed progressive symptoms of exertional shortness of breath and fatigue. Transesophageal echocardiogram was performed 09/01/2014 and confirmed the presence of severe aortic stenosis. The patient subsequent underwent diagnostic cardiac catheterization on 09/18/2014.  The patient was found to have nonobstructive coronary artery disease, although the ostial right coronary artery was not visualized well. Transvalvular gradient was not assessed at the time of catheterization.  The patient was referred for surgical consultation and evaluated by Dr. Cyndia Bent on 09/30/2014. He noted the presence of significant calcification involving the  aorta with complex abdominal aortic stent graft, left Gore-Tex femoral-popliteal bypass graft, moderate carotid artery stenosis, and stenosis of the innominate and right subclavian and left subclavian arteries. Because of this and the patient's numerous associated comorbid medical problems he was felt to be high risk for conventional surgery. The patient has subsequently undergone CT angiography to further characterize the potential feasibility of transcatheter aortic valve replacement as an alternative to high risk conventional surgery. The patient has been referred for second surgical opinion.  The patient is married and lives with his wife in [place]. He has been retired for 15 years from [employer]. He enjoys playing golf.  He has remained reasonably active and functional independent although his mobility is somewhat limited and he suffers from long-standing rheumatoid arthritis. He describes progressive symptoms of exertional shortness of breath and fatigue consistent with chronic diastolic congestive heart failure, New York Heart Association functional class II. He had 2 episodes of profound weakness and near syncope. He denies any symptoms of resting shortness of breath, PND, orthopnea, palpitations, or lower extremity edema. He has never had any chest pain or chest tightness with activity.  Past Medical History  Diagnosis Date  . PVD (peripheral vascular disease)     with abdominal aortic aneurysm and claudication, s/p repair.  Marland Kitchen Hyperlipidemia   . Hypertension   . Valvular heart disease     moderate to severe aortic stenosis  . AAA (abdominal aortic aneurysm)     s/p repair/stent placement  . Heart murmur   . Carotid artery occlusion 2014  . Bilateral cataracts   . Tremor   . History of alcoholism   . History of seizures   . Anemia   . Osteoarthritis   . Rheumatoid arthritis   . Anemia   . Coronary  artery disease   . Shortness of breath dyspnea      with exertion  . Seizures     40 years ago- heavy drinker- at that time  . Diabetes mellitus without complication     not on medication- any longer . "last A1C was 5 something"  . Personal history of kidney stones   . History of blood transfusion   . Gastric ulcer     hx of  . Basal cell carcinoma     Melonoma on right arm    Past Surgical History  Procedure Laterality Date  . Femoral bypass      left leg  . Cataract extraction, bilateral Bilateral   . Hernia repair Bilateral 1996  . Abdominal aortic aneurysm repair  08/29/2007    stent graft repair  . Cystoscopy    . Carotid endarterectomy  2014    right   . Abdominal aortic aneurysm repair    . Cardiac catheterization N/A 09/18/2014    Procedure: Left Heart Cath;  Surgeon: Minna Merritts, MD;  Location: [facility];  Service: Cardiovascular;  Laterality: N/A;  . Colonoscopy      Family History  Problem Relation Age of Onset  . Heart disease Brother     History   Social History  . Marital Status: Married    Spouse Name: N/A  . Number of Children: N/A  . Years of Education: N/A   Occupational History  . Not on file.   Social History Main Topics  . Smoking status: Former Smoker -- 1.00 packs/day for 50 years    Types: Cigarettes    Quit date: 04/17/2003  . Smokeless tobacco: Current User    Types: Chew  . Alcohol Use: No     Comment: Sober for  29 years  . Drug Use: No  . Sexual Activity: Not on file   Other Topics Concern  . Not on file   Social History Narrative    Current Outpatient Prescriptions  Medication Sig Dispense Refill  . aspirin 81 MG tablet Take 81 mg by mouth daily.    . carbidopa-levodopa (SINEMET IR) 25-100 MG per tablet Take 1 tablet by mouth daily.     . cilostazol (PLETAL) 100 MG tablet Take 100 mg by mouth daily.     Marland Kitchen esomeprazole (NEXIUM) 20 MG capsule TAKE ONE CAPSULE BY MOUTH TWICE DAILY    . ferrous fumarate (HEMOCYTE - 106 MG FE) 325 (106 FE) MG TABS tablet Take 1  tablet by mouth daily.    . folic acid (FOLVITE) 1 MG tablet Take 1 mg by mouth daily.    . hydroxychloroquine (PLAQUENIL) 200 MG tablet Take 200 mg by mouth daily.     Marland Kitchen lisinopril (PRINIVIL,ZESTRIL) 20 MG tablet Take 20 mg by mouth daily.    . methotrexate (RHEUMATREX) 2.5 MG tablet Take 15 mg by mouth every 7 (seven) days.    . rosuvastatin (CRESTOR) 20 MG tablet Take 10 mg by mouth daily.      No current facility-administered medications for this visit.    Allergies  Allergen Reactions  . Simvastatin     Myalgia       Review of Systems:  General:normal appetite, decreased energy, no weight gain, no weight loss, no fever Cardiac:no chest pain with exertion, no chest pain at rest, moderate SOB with heavy exertion, no resting SOB, no PND, no orthopnea, no palpitations, no arrhythmia, no atrial fibrillation, no LE edema, two dizzy spells with near  syncope Respiratory: no home oxygen, no productive cough, no dry cough, no bronchitis, no wheezing, no hemoptysis, no asthma, no pain with inspiration or cough, no sleep apnea, no CPAP at night GI:no difficulty swallowing, has reflux, no frequent heartburn, no hiatal hernia, no abdominal pain, no constipation, no diarrhea, no hematochezia, no hematemesis, no melena GU:no dysuria, no frequency, no urinary tract infection, no hematuria, no enlarged prostate, has kidney stones, no kidney disease Vascular:has pain suggestive of claudication, no pain in feet, no leg cramps, no varicose veins, no DVT, no non-healing foot ulcer Neuro:no stroke, no TIA's, no seizures, no headaches, notemporary blindness one eye, no slurred speech, no peripheral neuropathy, no chronic pain, no instability of gait, no  memory/cognitive dysfunction Musculoskeletal:has arthritis, has joint swelling, no myalgias, no difficulty walking, decreased mobility  Skin:no rash, no itching, no skin infections, no pressure sores or ulcerations Psych:no anxiety, no depression, no nervousness, no unusual recent stress Eyes:no blurry vision, no floaters, no recent vision changes, wears glasses or contacts ENT:has hearing loss, no loose or painful teeth, wears dentures, last saw dentist recently. He had all of his remaining teeth removed and has an upper denture. Lower denture no made yet Hematologic:has easy bruising, no abnormal bleeding, no clotting disorder, no frequent epistaxis Endocrine:has diabetes, does check CBG's at home periodically.          Physical Exam:   BP 124/80 mmHg  Pulse 42  Resp 20  Ht 5\' 9"  (1.753 m)  Wt 164 lb (74.39 kg)  BMI 24.21 kg/m2  SpO2 96%  General:  Elderly but o/w  well-appearing  HEENT:  Unremarkable   Neck:   no JVD, no bruits, no adenopathy   Chest:   clear to auscultation, symmetrical breath sounds, no wheezes, no rhonchi   CV:   RRR, grade III/VI crescendo/decrescendo murmur heard best at RUSB,  no diastolic murmur  Abdomen:  soft, non-tender, no masses   Extremities:  warm, well-perfused, pulses diminished but palpable, no LE edema  Rectal/GU  Deferred  Neuro:   Grossly non-focal and symmetrical throughout  Skin:   Clean and dry, no rashes, no breakdown   Diagnostic Tests:  Transthoracic Echocardiography  Patient:  Chase Bell, Teixeira MR #:    [id number] Study Date: 05/23/2014 Gender:   M Age:    20 Height:   175.3 cm Weight:   80.3 kg BSA:    1.99 m^2 Pt. Status: Room:  ATTENDING  Esmond Plants, MD,  [facility] SONOGRAPHER [facility] ORDERING   Nutrioso, Tim REFERRING  Gargatha, Tim PERFORMING  [facility], [place]  cc:  -------------------------------------------------------------------  ------------------------------------------------------------------- History:  PMH:  Syncope and murmur. Coronary artery disease. Aortic valve disease. Risk factors: Hypertension. Diabetes mellitus. Dyslipidemia.  ------------------------------------------------------------------- Study Conclusions  - Left ventricle: The cavity size was normal. There was mild concentric hypertrophy. Systolic function was normal. Wall motion was normal; there were no regional wall motion abnormalities. Doppler parameters are consistent with abnormal left ventricular relaxation (grade 1 diastolic dysfunction). - Aortic valve: Transvalvular velocity was increased. There was severe stenosis. There was mild to moderate regurgitation. Mean gradient (S): 45 mm Hg. Peak gradient (S): 79 mm Hg. Valve area (VTI): 0.82 cm^2. - Mitral valve: Calcified annulus. - Left atrium: The atrium was moderately dilated. - Right ventricle: Systolic function was normal. - Pulmonary arteries: Systolic pressure was within the normal range.  ------------------------------------------------------------------- Labs, prior tests, procedures, and surgery: Echocardiography (April 2014).  The aortic valve showed moderate to severe stenosis. Aortic valve: peak gradient of 52  mm Hg.  Transthoracic echocardiography. M-mode, complete 2D, spectral Doppler, and color Doppler. Birthdate: Patient birthdate: [birth year]. Age: Patient is 80 yr old. Sex: Gender: male. BMI: 26.1 kg/m^2. Blood pressure:   142/72 Patient status: Inpatient. Study date: Study date: 05/23/2014. Study time:  01:08 PM.  -------------------------------------------------------------------  ------------------------------------------------------------------- Left ventricle: The cavity size was normal. There was mild concentric hypertrophy. Systolic function was normal. Wall motion was normal; there were no regional wall motion abnormalities. Doppler parameters are consistent with abnormal left ventricular relaxation (grade 1 diastolic dysfunction).  ------------------------------------------------------------------- Aortic valve: Poorly visualized. Trileaflet; normal thickness, severely calcified leaflets. Mobility was not restricted. Doppler: Transvalvular velocity was increased. There was severe stenosis. There was mild to moderate regurgitation.  VTI ratio of LVOT to aortic valve: 0.21. Valve area (VTI): 0.82 cm^2. Indexed valve area (VTI): 0.41 cm^2/m^2. Valve area (Vmax): 0.81 cm^2. Indexed valve area (Vmax): 0.41 cm^2/m^2. Mean velocity ratio of LVOT to aortic valve: 0.23. Valve area (Vmean): 0.86 cm^2. Indexed valve area (Vmean): 0.43 cm^2/m^2.  Mean gradient (S): 45 mm Hg. Peak gradient (S): 79 mm Hg.  ------------------------------------------------------------------- Aorta: Aortic root: The aortic root was mildly dilated.  ------------------------------------------------------------------- Mitral valve:  Calcified annulus. Mobility was not restricted. Doppler: Transvalvular velocity was within the normal range. There was no evidence for stenosis. There was no regurgitation.  Valve area by pressure half-time: 1.76 cm^2. Indexed valve area by pressure half-time: 0.88 cm^2/m^2.  ------------------------------------------------------------------- Left atrium: The atrium was moderately dilated.  ------------------------------------------------------------------- Right ventricle: The cavity size was normal. Wall thickness was normal. Systolic function was  normal.  ------------------------------------------------------------------- Pulmonic valve:  Structurally normal valve.  Cusp separation was normal. Doppler: Transvalvular velocity was within the normal range. There was no evidence for stenosis. There was no regurgitation.  ------------------------------------------------------------------- Tricuspid valve:  Structurally normal valve.  Doppler: Transvalvular velocity was within the normal range. There was no regurgitation.  ------------------------------------------------------------------- Pulmonary artery:  The main pulmonary artery was normal-sized. Systolic pressure was within the normal range.  ------------------------------------------------------------------- Right atrium: The atrium was normal in size.  ------------------------------------------------------------------- Pericardium: There was no pericardial effusion.  ------------------------------------------------------------------- Systemic veins: Inferior vena cava: The vessel was normal in size.  ------------------------------------------------------------------- Measurements  Left ventricle              Value     Reference LV ID, ED, PLAX chordal          45.7 mm    43 - 52 LV ID, ES, PLAX chordal          28  mm    23 - 38 LV fx shortening, PLAX chordal      39  %    >=29 LV PW thickness, ED            18.3 mm    --------- IVS/LV PW ratio, ED            0.83      <=1.3 Stroke volume, 2D             78  ml    --------- Stroke volume/bsa, 2D           39  ml/m^2  --------- LV e&', lateral              3.98 cm/s   --------- LV E/e&', lateral             12.14     --------- LV e&', medial               4.05  cm/s   --------- LV E/e&', medial              11.93      --------- LV e&', average              4.02 cm/s   --------- LV E/e&', average             12.03     ---------  Ventricular septum            Value     Reference IVS thickness, ED             15.2 mm    ---------  LVOT                   Value     Reference LVOT ID, S                22  mm    --------- LVOT area                 3.8  cm^2   --------- LVOT ID                  22  mm    --------- LVOT peak velocity, S           94.4 cm/s   --------- LVOT mean velocity, S           70.7 cm/s   --------- LVOT VTI, S                20.4 cm    --------- LVOT peak gradient, S           4   mm Hg  --------- Stroke volume (SV), LVOT DP        77.5 ml    --------- Stroke index (SV/bsa), LVOT DP      39  ml/m^2  ---------  Aortic valve               Value     Reference Aortic valve mean velocity, S       312  cm/s   --------- Aortic valve VTI, S            95.1 cm    --------- Aortic mean gradient, S          45  mm Hg  --------- Aortic peak gradient, S          79  mm Hg  --------- VTI ratio, LVOT/AV            0.21      --------- Aortic valve area, VTI          0.82 cm^2   --------- Aortic valve area/bsa, VTI        0.41 cm^2/m^2 --------- Aortic valve area, peak velocity     0.81 cm^2   --------- Aortic valve area/bsa, peak        0.41 cm^2/m^2 --------- velocity Velocity ratio, mean, LVOT/AV       0.23      --------- Aortic valve area, mean velocity     0.86 cm^2   --------- Aortic valve area/bsa, mean        0.43 cm^2/m^2 --------- velocity Aortic regurg pressure half-time      616  ms    ---------  Aorta                   Value     Reference Aortic root ID, ED  41  mm    ---------  Left atrium                Value     Reference LA ID, A-P, ES              49  mm    --------- LA ID/bsa, A-P          (H)   2.46 cm/m^2  <=2.2 LA volume, S               60  ml    --------- LA volume/bsa, S             30.2 ml/m^2  --------- LA volume, ES, 1-p A4C          70  ml    --------- LA volume/bsa, ES, 1-p A4C        35.2 ml/m^2  --------- LA volume, ES, 1-p A2C          50  ml    --------- LA volume/bsa, ES, 1-p A2C        25.1 ml/m^2  ---------  Mitral valve               Value     Reference Mitral E-wave peak velocity        48.3 cm/s   --------- Mitral A-wave peak velocity        109  cm/s   --------- Mitral deceleration time     (H)   335  ms    150 - 230 Mitral pressure half-time         125  ms    --------- Mitral E/A ratio, peak          0.4      --------- Mitral valve area, PHT, DP        1.76 cm^2   --------- Mitral valve area/bsa, PHT, DP      0.88 cm^2/m^2 --------- Mitral maximal regurg velocity,      385  cm/s   --------- PISA  Pulmonary arteries            Value     Reference PA pressure, S, DP            30  mm Hg  <=30  Tricuspid valve              Value     Reference Tricuspid regurg peak velocity      261  cm/s   --------- Tricuspid peak RV-RA gradient       27  mm Hg  ---------  Systemic veins              Value     Reference Estimated CVP               3   mm Hg  ---------  Right ventricle               Value     Reference RV pressure, S, DP            30  mm Hg  <=30 RV s&', lateral, S             12.5 cm/s   ---------  Legend: (L) and (H) mark values outside specified reference range.  ------------------------------------------------------------------- Prepared and Electronically Authenticated by  Esmond Plants, MD, [facility] 2016-01-08T17:11:19    CARDIAC CATHETERIZATION   Conclusion     Nondominant right coronary system.  Ostial RCA not well visualized,  unable to exclude stenosis (small to moderate sized vessel)  Dist LAD lesion, 40% stenosed.  Otherwise mild lumenal irregularies  Unable to cross the aortic valve secondary to severe stenosis.  Challenging case secondary to access in the right groin (significant scar tissue) and difficulty extending through the AAA endograft.     Coronary Findings    Dominance: Left   Left Anterior Descending   . Dist LAD lesion, 40% stenosed.      Left Heart    Aortic Valve There is severe aortic valve stenosis. The aortic valve is calcified. There is restricted aortic valve motion.   Aorta The size of the ascending aorta is normal.    Coronary Diagrams    Diagnostic Diagram            Hemo Data    AO Systolic Cath Pressure AO Diastolic Cath Pressure AO Mean Cath Pressure   129 59 mmHg 82 mmHg   123 56 mmHg 80 mmHg   119 44 mmHg 69 mmHg           Cardiac TAVR CT  TECHNIQUE: The patient was scanned on a Philips 256 scanner. A 120 kV retrospective scan was triggered in the descending thoracic aorta at 111 HU's. Gantry rotation speed was 270 msecs and collimation was .9 mm. No beta blockade or nitro were given. The 3D data set was reconstructed in 5% intervals of the R-R cycle. Systolic and diastolic phases were analyzed on a dedicated work station using MPR, MIP and VRT modes. The patient received 80 cc of  contrast.  FINDINGS: Aortic Valve: Heavily calcified trileaflet aortic valve. No significant calcification of the STJ or annulus. No significant mitral annular calcification or basal septal bulging into the LVOT  Aorta: Mildly dilated aortic root. Mild calcification of the arch and descending thoracic aorta with mild mural aortic debris in the descending thoracic aorta  Sinotubular Junction: 31 mm  Ascending Thoracic Aorta: 4.2 cm  Aortic Arch: 32 mm  Descending Thoracic Aorta: 30 mm  Sinus of Valsalva Measurements:  Non-coronary: 36 mm  Right -coronary: 35.7 mm  Left -coronary: 35.6 mm  Coronary Artery Height above Annulus:  Left Main: 13.8 mm above annulus  Right Coronary: 17.7 mm above annulus  Virtual Basal Annulus Measurements:  Maximum/Minimum Diameter: 27 x 24.5 mm in 30% phase  Perimeter: 85 mm  Area: 539 mm2  Coronary Arteries: Calcified sufficient height above annulus for deployment see cath report  Optimum Fluoroscopic Angle for Delivery: LAO 19 degrees Cranial 1 degree  IMPRESSION: 1) Calcified trileaflet aortic valve probably best suited for a 29 mm Sapien 3 valve. Area is lower limit but generous sinuses and lack of calcium in STJ, annulus and mitral annulus suggests bigger valve would possible  2) Suitable coronary height above annulus for deployment  3) No aortic root contraindications to transaortic or tranapical delivery  4) No LAA thrombus patient in afib during study  5) Optimum angiographic angle for delivery LAO 19 degrees Cranial 1 degree  Jenkins Rouge   Electronically Signed  By: Jenkins Rouge M.D.  On: 10/07/2014 15:51    CT ANGIOGRAPHY CHEST, ABDOMEN AND PELVIS  TECHNIQUE: Multidetector CT imaging through the chest, abdomen and pelvis was performed using the standard protocol during bolus administration of intravenous contrast. Multiplanar reconstructed images and MIPs  were obtained and reviewed to evaluate the vascular anatomy.  CONTRAST: 60mL OMNIPAQUE IOHEXOL 350 MG/ML SOLN  COMPARISON: CTA of the abdomen and pelvis 05/22/2013. CTA of the chest, abdomen and  pelvis 06/21/2006.  FINDINGS: CTA CHEST FINDINGS  Mediastinum/Lymph Nodes: Heart size is borderline enlarged with evidence of concentric left ventricular hypertrophy. The apex of the left ventricular cavity is immediately deep to the anterior aspect of the left fifth-sixth intercostal space. There is no significant pericardial fluid, thickening or pericardial calcification. Severely thickened and heavily calcified aortic valve, compatible with the reported clinical history of aortic stenosis. Mild mitral annular calcifications. There is atherosclerosis of the thoracic aorta, the great vessels of the mediastinum and the coronary arteries, including calcified atherosclerotic plaque in the left main, left anterior descending left circumflex and right coronary arteries. In addition, there are moderate stenosis of the subclavian arteries bilaterally, measuring 5.2 x 4.1 mm proximally on the right, and 5.9 x 4.1 mm proximally on the left. No pathologically enlarged mediastinal or hilar lymph nodes. Esophagus is unremarkable in appearance. No axillary lymphadenopathy.  Lungs/Pleura: Patchy areas of subpleural reticulation are noted throughout the periphery of the lungs bilaterally. Mild centrilobular and paraseptal emphysema. Mild diffuse bronchial wall thickening. No acute consolidative airspace disease. No pleural effusions. No suspicious appearing pulmonary nodules or masses.  Musculoskeletal/Soft Tissues: There are no aggressive appearing lytic or blastic lesions noted in the visualized portions of the skeleton.  CTA ABDOMEN AND PELVIS FINDINGS  Hepatobiliary: 1.4 cm low-attenuation lesion in segment 5 of the liver is unchanged, and compatible with a small simple cyst.  No other suspicious appearing cystic for solid hepatic lesions. No intra or extrahepatic biliary ductal dilatation. Gallbladder is largely collapsed common otherwise unremarkable in appearance.  Pancreas: No pancreatic mass. No pancreatic ductal dilatation. No pancreatic or peripancreatic fluid or inflammatory changes.  Spleen: Unremarkable.  Adrenals/Urinary Tract: Bilateral adrenal glands are normal in appearance. Multiple sub cm low-attenuation lesions in the kidneys bilaterally are too small to definitively characterize, but are favored to represent small cysts. In addition, there are multiple larger well-defined low-attenuation nonenhancing renal lesions bilaterally, largest of which is an exophytic 5.8 cm lesion in the lower pole of the left kidney, compatible with simple cysts. No hydroureteronephrosis. Urinary bladder is normal in appearance.  Stomach/Bowel: Normal appearance of the stomach. No pathologic dilatation of small bowel or colon. Numerous colonic diverticulae are noted, most evident in the region of the sigmoid colon, without surrounding inflammatory changes to suggest an acute diverticulitis at this time.  Vascular/Lymphatic: Vascular findings and measurements pertinent to potential TAVR procedure, as detailed below. Aneurysmal dilatation of the suprarenal abdominal aorta which measures up to 3.7 x 3.2 cm at the level of the origin of the superior mesenteric artery (image 150 of series 401), slightly increased compared to the prior study. In addition, there is a fusiform infrarenal abdominal aortic aneurysm, status post aortobi-iliac stent graft placement. The largest portion of the aneurysm sac measures up to 6.3 x 5.5 cm (image 214 of series 401), which is slightly increased compared to prior study 05/22/2013 (at which point this measured 6.0 x 5.1 cm). Image 189 of series 401 demonstrates a probable blush of contrast material within the aneurysm sac  posteriorly on the right side, suggestive of type 2 endoleak. Additionally, in the inferior aspect of the aortic portion of the graft, there appears to be separation of the struts from the graft fabric, best appreciated on image 219 of series 401, where contrast is seen beyond the outer confines of the struts, but well in capsulated within the graft fabric. This aneurysm extends into the common iliac arteries bilaterally, with the right common iliac artery dilated up to  2.7 cm in diameter, and the left common iliac artery dilated up to 2.3 cm in diameter, both similar to the prior examination. 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 There is a large number of embolization coils in the right common iliac artery (unchanged), which causes extensive beam hardening artifact in the right hemipelvis, partially obscuring the distal right common iliac artery vasculature. Single renal arteries bilaterally, with moderate to severe stenosis on the right and moderate stenosis on the left. There is also high-grade stenosis of the proximal right superficial femoral artery. No lymphadenopathy noted in the abdomen or pelvis.  Reproductive: Prostate gland and seminal vesicles are unremarkable in appearance.  Other: Postoperative changes of bilateral inguinal herniorrhaphy with multiple soft tissue anchors from mesh repair bilaterally. No significant volume of ascites. No pneumoperitoneum.  Musculoskeletal: There are no aggressive appearing lytic or blastic lesions noted in the visualized portions of the skeleton.  VASCULAR MEASUREMENTS PERTINENT TO TAVR:  AORTA:  Minimal Aortic Diameter - 19 x 13 mm  Severity of Aortic Calcification - moderate  RIGHT PELVIS:  Right Common Iliac Artery -  Minimal Diameter - 10.1 x 9.6 mm in the visualized portion of the vessel (distal right common iliac artery is completely obscured by beam hardening artifact  from the a large amount of embolization coils in the proximal right internal iliac artery).  Tortuosity - mild  Calcification - stent graft present  Right External Iliac Artery -  Minimal Diameter - 6.4 x 5.7 mm  Tortuosity - moderate to severe  Calcification - moderate (stent graft present proximally)  Right Common Femoral Artery -  Minimal Diameter - 5.9 x 4.9 mm  Tortuosity - mild  Calcification - moderate  LEFT PELVIS:  Left Common Iliac Artery -  Minimal Diameter - 10.9 x 8.7 mm  Tortuosity - mild  Calcification - stent graft present  Left External Iliac Artery -  Minimal Diameter - 5.6 x 5.4 mm  Tortuosity - moderate  Calcification - moderate (stent graft present proximally)  Left Common Femoral Artery -  Minimal Diameter - 5.4 x 4.8 mm  Tortuosity - mild  Calcification - moderate  Review of the MIP images confirms the above findings.  IMPRESSION: 1. Vascular findings and measurements pertinent to potential TAVR procedure, as detailed above. This patient does not appear to have suitable pelvic arterial access. As well, there is moderate to severe stenosis of the proximal subclavian arteries bilaterally. 2. There are both suprarenal and infrarenal abdominal aortic aneurysms, in addition to aneurysmal dilatation of the common iliac arteries bilaterally. Patient is status post aortobi-iliac stent graft placement, and there continues to be slight progressive enlargement of the excluded aneurysm sac, with evidence of potential type 2 endoleak (likely from a lumbar artery) on today's examination, as discussed above. 3. Colonic diverticulosis without findings to suggest acute diverticulitis at this time. 4. Additional incidental findings, as above.   Electronically Signed  By: Vinnie Langton M.D.  On: 10/14/2014 15:04   STS Risk Calculator  Procedure    AVR  Risk of Mortality   5.0% Morbidity or  Mortality  24.9% Prolonged LOS   11.1% Short LOS    26.1% Permanent Stroke   2.3% Prolonged Vent Support  11.9% DSW Infection    0.3% Renal Failure    10.2% Reoperation    9.8%    Impression:  Patient has stage D severe symptomatic aortic stenosis. He presents with recent onset symptoms of exertional shortness of breath and fatigue consistent  with chronic diastolic congestive heart failure, New York Heart Association functional class II. He has also suffered 2 episodes of profound generalized weakness and near syncope. I personally reviewed the patient's recent transthoracic echocardiogram and diagnostic cardiac catheterization. The patient's aortic valve is severely calcified with thickening and restricted leaflet mobility involving all 3 leaflets. Peak velocity across aortic valve measures greater than 4 m/s corresponding to mean transvalvular gradient estimated 45 mmHg. Left ventricular systolic function remains normal. Cardiac catheterization is notable for the absence of significant coronary artery disease. Risks associated with conventional surgical aortic valve replacement would be relatively high and likely considerably higher than that predicted using the STS risk calculator.  The patient has severe vascular disease as well as chronic immunosuppression related to long term methotrexate therapy for rheumatoid arthritis. CT angiography demonstrates anatomical characteristics suitable for transcatheter aortic valve replacement using a 29 mm Edwards Sapien XT transcatheter heart valve. He does not have adequate pelvic vascular access for transfemoral approach. I agree that direct aortic approach may prove to be the best alternative.   Plan:  The patient and his wife were counseled at length regarding treatment alternatives for management of severe symptomatic aortic stenosis. Alternative approaches such as conventional aortic valve replacement, transcatheter aortic valve replacement, and  palliative medical therapy were compared and contrasted at length.  The risks associated with conventional surgical aortic valve replacement were been discussed in detail, as were expectations for post-operative convalescence. Long-term prognosis with medical therapy was discussed. This discussion was placed in the context of the patient's own specific clinical presentation and past medical history.  The patient desires to proceed with transcatheter aortic valve replacement as previously planned.    Following the decision to proceed with transcatheter aortic valve replacement, a discussion has been held regarding what types of management strategies would be attempted intraoperatively in the event of life-threatening complications, including whether or not the patient would be considered a candidate for the use of cardiopulmonary bypass and/or conversion to open sternotomy for attempted surgical intervention.  The patient has been advised of a variety of complications that might develop including but not limited to risks of death, stroke, paravalvular leak, aortic dissection or other major vascular complications, aortic annulus rupture, device embolization, cardiac rupture or perforation, mitral regurgitation, acute myocardial infarction, arrhythmia, heart block or bradycardia requiring permanent pacemaker placement, congestive heart failure, respiratory failure, renal failure, pneumonia, infection, other late complications related to structural valve deterioration or migration, or other complications that might ultimately cause a temporary or permanent loss of functional independence or other long term morbidity.  The patient provides full informed consent for the procedure as described and all questions were answered.  The patient has been instructed to stop taking methotrexate in anticipation of surgery.   I spent in excess of 90 minutes during the conduct of this office consultation and >50% of this time  involved direct face-to-face encounter with the patient for counseling and/or coordination of their care.      Valentina Gu. Roxy Manns, MD 11/13/2014 7:42 PM

## 2014-11-13 NOTE — Progress Notes (Signed)
   11/13/14 1423  OBSTRUCTIVE SLEEP APNEA  Have you ever been diagnosed with sleep apnea through a sleep study? No  Do you snore loudly (loud enough to be heard through closed doors)?  1  Do you often feel tired, fatigued, or sleepy during the daytime? 1  Has anyone observed you stop breathing during your sleep? 1  Do you have, or are you being treated for high blood pressure? 1  BMI more than 35 kg/m2? 0  Age over 78 years old? 1  Neck circumference greater than 40 cm/16 inches? 0  Gender: 1

## 2014-11-13 NOTE — Patient Instructions (Signed)
Patient should stop taking Methotrexate  Patient should continue taking all medications without change through the day before surgery.  Patient should have nothing to eat or drink after midnight the night before surgery.  On the morning of surgery patient should take only Nexium with a sip of water.

## 2014-11-13 NOTE — Pre-Procedure Instructions (Signed)
    Chase KYLEY LAUREL Sr.  11/13/2014      Your procedure is scheduled on Tuesday, July 5.  Report to [facility] Admitting at 5:30A.M.                Your surgery is scheduled for 7:45   Call this number if you have problems the morning of surgery:  [phone]     Remember:  Do not eat food or drink liquids after midnight Monday, July 4.   Take these medicines the morning of surgery with A SIP OF WATER: carbidopa-levodopa (SINEMET IR), esomeprazole ([place]).    Do not wear jewelry, make-up or nail polish.  Do not wear lotions, powders, or perfumes.    Men may shave face and neck.  Do not bring valuables to the hospital.  [facility] is not responsible for any belongings or valuables.  Contacts, dentures or bridgework may not be worn into surgery.  Leave your suitcase in the car.  After surgery it may be brought to your room.  For patients admitted to the hospital, discharge time will be determined by your treatment team.  Special instructions:  Review  [facility] - Preparing For Surgery.  Please read over the following fact sheets that you were given. Pain Booklet, Coughing and Deep Breathing, Blood Transfusion Information and Surgical Site Infection Prevention

## 2014-11-13 NOTE — Telephone Encounter (Signed)
This encounter was created in error - please disregard.

## 2014-11-14 LAB — HEMOGLOBIN A1C
HEMOGLOBIN A1C: 5.9 % — AB (ref 4.8–5.6)
Mean Plasma Glucose: 123 mg/dL

## 2014-11-17 MED ORDER — SODIUM CHLORIDE 0.9 % IV SOLN
INTRAVENOUS | Status: DC
  Filled 2014-11-17 (×2): qty 2.5

## 2014-11-17 MED ORDER — HEPARIN SODIUM (PORCINE) 1000 UNIT/ML IJ SOLN
INTRAMUSCULAR | Status: DC
  Filled 2014-11-17 (×2): qty 30

## 2014-11-17 MED ORDER — METOPROLOL TARTRATE 12.5 MG HALF TABLET
12.5000 mg | ORAL_TABLET | Freq: Once | ORAL | Status: DC
  Filled 2014-11-17: qty 1

## 2014-11-17 MED ORDER — PHENYLEPHRINE HCL 10 MG/ML IJ SOLN
30.0000 ug/min | INTRAVENOUS | Status: DC
  Filled 2014-11-17 (×2): qty 2

## 2014-11-17 MED ORDER — DEXTROSE 5 % IV SOLN
1.5000 g | INTRAVENOUS | Status: AC
  Administered 2014-11-18: 1.5 g via INTRAVENOUS
  Filled 2014-11-17 (×2): qty 1.5

## 2014-11-17 MED ORDER — NOREPINEPHRINE BITARTRATE 1 MG/ML IV SOLN
0.0000 ug/min | INTRAVENOUS | Status: DC
  Filled 2014-11-17 (×2): qty 4

## 2014-11-17 MED ORDER — NITROGLYCERIN IN D5W 200-5 MCG/ML-% IV SOLN
2.0000 ug/min | INTRAVENOUS | Status: DC
  Filled 2014-11-17 (×2): qty 250

## 2014-11-17 MED ORDER — DEXMEDETOMIDINE HCL IN NACL 400 MCG/100ML IV SOLN
0.1000 ug/kg/h | INTRAVENOUS | Status: AC
  Administered 2014-11-18: .3 ug/kg/h via INTRAVENOUS
  Filled 2014-11-17 (×2): qty 100

## 2014-11-17 MED ORDER — DEXTROSE 5 % IV SOLN
0.0000 ug/min | INTRAVENOUS | Status: DC
Start: 2014-11-18 — End: 2014-11-18
  Filled 2014-11-17 (×2): qty 4

## 2014-11-17 MED ORDER — MAGNESIUM SULFATE 50 % IJ SOLN
40.0000 meq | INTRAMUSCULAR | Status: DC
  Filled 2014-11-17 (×2): qty 10

## 2014-11-17 MED ORDER — POTASSIUM CHLORIDE 2 MEQ/ML IV SOLN
80.0000 meq | INTRAVENOUS | Status: DC
  Filled 2014-11-17 (×2): qty 40

## 2014-11-17 MED ORDER — DOPAMINE-DEXTROSE 3.2-5 MG/ML-% IV SOLN
0.0000 ug/kg/min | INTRAVENOUS | Status: DC
  Filled 2014-11-17: qty 250

## 2014-11-17 MED ORDER — VANCOMYCIN HCL 10 G IV SOLR
1250.0000 mg | INTRAVENOUS | Status: AC
  Administered 2014-11-18: 1250 mg via INTRAVENOUS
  Filled 2014-11-17 (×2): qty 1250

## 2014-11-17 MED ORDER — CHLORHEXIDINE GLUCONATE 4 % EX LIQD: 30.0000 mL | CUTANEOUS | Status: DC

## 2014-11-18 ENCOUNTER — Inpatient Hospital Stay (HOSPITAL_COMMUNITY): Payer: Medicare HMO | Admitting: Anesthesiology

## 2014-11-18 ENCOUNTER — Inpatient Hospital Stay (HOSPITAL_COMMUNITY): Payer: Medicare HMO

## 2014-11-18 ENCOUNTER — Encounter (HOSPITAL_COMMUNITY)
Admission: RE | Disposition: A | Discharge status: Home / Self Care | Payer: Medicare HMO | Source: Ambulatory Visit | Attending: Surgery

## 2014-11-18 ENCOUNTER — Encounter (HOSPITAL_COMMUNITY): Payer: Self-pay | Admitting: Anesthesiology

## 2014-11-18 ENCOUNTER — Inpatient Hospital Stay (HOSPITAL_COMMUNITY)
Admission: RE | Admit: 2014-11-18 | Discharge: 2014-11-22 | DRG: 267 | Disposition: A | Discharge status: Home / Self Care | Payer: Medicare HMO | Source: Ambulatory Visit | Attending: Surgery | Admitting: Surgery

## 2014-11-18 DIAGNOSIS — D62 Acute posthemorrhagic anemia: Secondary | ICD-10-CM | POA: Diagnosis not present

## 2014-11-18 DIAGNOSIS — E785 Hyperlipidemia, unspecified: Secondary | ICD-10-CM | POA: Diagnosis present

## 2014-11-18 DIAGNOSIS — Z79899 Other long term (current) drug therapy: Secondary | ICD-10-CM

## 2014-11-18 DIAGNOSIS — I350 Nonrheumatic aortic (valve) stenosis: Secondary | ICD-10-CM | POA: Diagnosis present

## 2014-11-18 DIAGNOSIS — Z7982 Long term (current) use of aspirin: Secondary | ICD-10-CM | POA: Diagnosis not present

## 2014-11-18 DIAGNOSIS — I739 Peripheral vascular disease, unspecified: Secondary | ICD-10-CM | POA: Diagnosis present

## 2014-11-18 DIAGNOSIS — I2510 Atherosclerotic heart disease of native coronary artery without angina pectoris: Secondary | ICD-10-CM | POA: Diagnosis present

## 2014-11-18 DIAGNOSIS — M069 Rheumatoid arthritis, unspecified: Secondary | ICD-10-CM | POA: Diagnosis present

## 2014-11-18 DIAGNOSIS — I679 Cerebrovascular disease, unspecified: Secondary | ICD-10-CM | POA: Diagnosis present

## 2014-11-18 DIAGNOSIS — Z952 Presence of prosthetic heart valve: Secondary | ICD-10-CM

## 2014-11-18 DIAGNOSIS — I10 Essential (primary) hypertension: Secondary | ICD-10-CM | POA: Diagnosis present

## 2014-11-18 DIAGNOSIS — Z87891 Personal history of nicotine dependence: Secondary | ICD-10-CM | POA: Diagnosis not present

## 2014-11-18 DIAGNOSIS — I701 Atherosclerosis of renal artery: Secondary | ICD-10-CM | POA: Diagnosis present

## 2014-11-18 DIAGNOSIS — E119 Type 2 diabetes mellitus without complications: Secondary | ICD-10-CM | POA: Diagnosis present

## 2014-11-18 DIAGNOSIS — Z006 Encounter for examination for normal comparison and control in clinical research program: Secondary | ICD-10-CM | POA: Diagnosis not present

## 2014-11-18 DIAGNOSIS — D696 Thrombocytopenia, unspecified: Secondary | ICD-10-CM | POA: Diagnosis not present

## 2014-11-18 DIAGNOSIS — Z9889 Other specified postprocedural states: Secondary | ICD-10-CM

## 2014-11-18 DIAGNOSIS — I359 Nonrheumatic aortic valve disorder, unspecified: Secondary | ICD-10-CM | POA: Diagnosis not present

## 2014-11-18 DIAGNOSIS — I6522 Occlusion and stenosis of left carotid artery: Secondary | ICD-10-CM | POA: Diagnosis present

## 2014-11-18 HISTORY — PX: TEE WITHOUT CARDIOVERSION: SHX5443

## 2014-11-18 HISTORY — PX: TRANSCATHETER AORTIC VALVE REPLACEMENT, TRANSAORTIC: SHX6402

## 2014-11-18 LAB — POCT I-STAT, CHEM 8
BUN: 16 mg/dL (ref 6–20)
BUN: 15 mg/dL (ref 6–20)
BUN: 14 mg/dL (ref 6–20)
BUN: 14 mg/dL (ref 6–20)
CALCIUM ION: 1.15 mmol/L (ref 1.13–1.30)
CREATININE: 0.9 mg/dL (ref 0.61–1.24)
CREATININE: 0.9 mg/dL (ref 0.61–1.24)
Calcium, Ion: 1.25 mmol/L (ref 1.13–1.30)
Calcium, Ion: 1.13 mmol/L (ref 1.13–1.30)
Calcium, Ion: 1.17 mmol/L (ref 1.13–1.30)
Chloride: 106 mmol/L (ref 101–111)
Chloride: 106 mmol/L (ref 101–111)
Chloride: 107 mmol/L (ref 101–111)
Chloride: 106 mmol/L (ref 101–111)
Creatinine, Ser: 1 mg/dL (ref 0.61–1.24)
Creatinine, Ser: 0.9 mg/dL (ref 0.61–1.24)
Glucose, Bld: 111 mg/dL — ABNORMAL HIGH (ref 65–99)
Glucose, Bld: 122 mg/dL — ABNORMAL HIGH (ref 65–99)
Glucose, Bld: 143 mg/dL — ABNORMAL HIGH (ref 65–99)
Glucose, Bld: 110 mg/dL — ABNORMAL HIGH (ref 65–99)
HCT: 30 % — ABNORMAL LOW (ref 39.0–52.0)
HCT: 27 % — ABNORMAL LOW (ref 39.0–52.0)
HCT: 25 % — ABNORMAL LOW (ref 39.0–52.0)
HCT: 30 % — ABNORMAL LOW (ref 39.0–52.0)
HEMOGLOBIN: 10.2 g/dL — AB (ref 13.0–17.0)
HEMOGLOBIN: 9.2 g/dL — AB (ref 13.0–17.0)
HEMOGLOBIN: 10.2 g/dL — AB (ref 13.0–17.0)
Hemoglobin: 8.5 g/dL — ABNORMAL LOW (ref 13.0–17.0)
POTASSIUM: 3.9 mmol/L (ref 3.5–5.1)
POTASSIUM: 4.6 mmol/L (ref 3.5–5.1)
Potassium: 4.3 mmol/L (ref 3.5–5.1)
Potassium: 4.3 mmol/L (ref 3.5–5.1)
SODIUM: 140 mmol/L (ref 135–145)
SODIUM: 139 mmol/L (ref 135–145)
SODIUM: 138 mmol/L (ref 135–145)
Sodium: 139 mmol/L (ref 135–145)
TCO2: 24 mmol/L (ref 0–100)
TCO2: 24 mmol/L (ref 0–100)
TCO2: 23 mmol/L (ref 0–100)
TCO2: 19 mmol/L (ref 0–100)

## 2014-11-18 LAB — POCT I-STAT 3, ART BLOOD GAS (G3+)
Acid-base deficit: 3 mmol/L — ABNORMAL HIGH (ref 0.0–2.0)
Bicarbonate: 22.6 mEq/L (ref 20.0–24.0)
O2 SAT: 99 %
Patient temperature: 98.4
TCO2: 24 mmol/L (ref 0–100)
pCO2 arterial: 42.3 mmHg (ref 35.0–45.0)
pH, Arterial: 7.336 — ABNORMAL LOW (ref 7.350–7.450)
pO2, Arterial: 165 mmHg — ABNORMAL HIGH (ref 80.0–100.0)

## 2014-11-18 LAB — CBC
HCT: 29.6 % — ABNORMAL LOW (ref 39.0–52.0)
HCT: 30.1 % — ABNORMAL LOW (ref 39.0–52.0)
HEMOGLOBIN: 9.3 g/dL — AB (ref 13.0–17.0)
HEMOGLOBIN: 9.6 g/dL — AB (ref 13.0–17.0)
MCH: 27.1 pg (ref 26.0–34.0)
MCH: 27.7 pg (ref 26.0–34.0)
MCHC: 31.4 g/dL (ref 30.0–36.0)
MCHC: 31.9 g/dL (ref 30.0–36.0)
MCV: 86.3 fL (ref 78.0–100.0)
MCV: 86.7 fL (ref 78.0–100.0)
Platelets: 127 10*3/uL — ABNORMAL LOW (ref 150–400)
Platelets: 114 10*3/uL — ABNORMAL LOW (ref 150–400)
RBC: 3.43 MIL/uL — AB (ref 4.22–5.81)
RBC: 3.47 MIL/uL — ABNORMAL LOW (ref 4.22–5.81)
RDW: 16.6 % — ABNORMAL HIGH (ref 11.5–15.5)
RDW: 16.7 % — ABNORMAL HIGH (ref 11.5–15.5)
WBC: 8.5 10*3/uL (ref 4.0–10.5)
WBC: 11.3 10*3/uL — ABNORMAL HIGH (ref 4.0–10.5)

## 2014-11-18 LAB — GLUCOSE, CAPILLARY
GLUCOSE-CAPILLARY: 117 mg/dL — AB (ref 65–99)
Glucose-Capillary: 83 mg/dL (ref 65–99)
Glucose-Capillary: 92 mg/dL (ref 65–99)

## 2014-11-18 LAB — PROTIME-INR
INR: 1.33 (ref 0.00–1.49)
Prothrombin Time: 16.6 seconds — ABNORMAL HIGH (ref 11.6–15.2)

## 2014-11-18 LAB — POCT I-STAT 4, (NA,K, GLUC, HGB,HCT)
Glucose, Bld: 132 mg/dL — ABNORMAL HIGH (ref 65–99)
HEMATOCRIT: 28 % — AB (ref 39.0–52.0)
HEMOGLOBIN: 9.5 g/dL — AB (ref 13.0–17.0)
Potassium: 4.6 mmol/L (ref 3.5–5.1)
SODIUM: 140 mmol/L (ref 135–145)

## 2014-11-18 LAB — CREATININE, SERUM
CREATININE: 1.12 mg/dL (ref 0.61–1.24)
GFR calc Af Amer: >60 mL/min (ref >60)

## 2014-11-18 LAB — MAGNESIUM: Magnesium: 1.7 mg/dL (ref 1.7–2.4)

## 2014-11-18 LAB — PREPARE RBC (CROSSMATCH)

## 2014-11-18 LAB — APTT: APTT: 32 s (ref 24–37)

## 2014-11-18 SURGERY — IMPLANTATION, AORTIC VALVE, TRANSCATHETER, TRANSAORTIC APPROACH
Anesthesia: General | Site: Chest

## 2014-11-18 MED ORDER — FAMOTIDINE IN NACL 20-0.9 MG/50ML-% IV SOLN
20.0000 mg | Freq: Two times a day (BID) | INTRAVENOUS | Status: DC
  Administered 2014-11-18: 20 mg via INTRAVENOUS

## 2014-11-18 MED ORDER — SODIUM CHLORIDE 0.9 % IJ SOLN
INTRAMUSCULAR | Status: AC
  Filled 2014-11-18: qty 10

## 2014-11-18 MED ORDER — INSULIN REGULAR BOLUS VIA INFUSION
0.0000 [IU] | Freq: Three times a day (TID) | INTRAVENOUS | Status: DC
  Filled 2014-11-18: qty 10

## 2014-11-18 MED ORDER — METOPROLOL TARTRATE 1 MG/ML IV SOLN: 2.5000 mg | INTRAVENOUS | Status: DC | PRN

## 2014-11-18 MED ORDER — HYDRALAZINE HCL 20 MG/ML IJ SOLN
10.0000 mg | INTRAMUSCULAR | Status: DC | PRN
  Administered 2014-11-18: 10 mg via INTRAVENOUS
  Filled 2014-11-18 (×2): qty 1

## 2014-11-18 MED ORDER — NITROGLYCERIN IN D5W 200-5 MCG/ML-% IV SOLN
INTRAVENOUS | Status: DC | PRN
  Administered 2014-11-18: 5 ug/min via INTRAVENOUS

## 2014-11-18 MED ORDER — ACETAMINOPHEN 160 MG/5ML PO SOLN: 650.0000 mg | Freq: Once | ORAL | Status: DC

## 2014-11-18 MED ORDER — ALBUMIN HUMAN 5 % IV SOLN
250.0000 mL | INTRAVENOUS | Status: DC | PRN
  Filled 2014-11-18: qty 250

## 2014-11-18 MED ORDER — NEOSTIGMINE METHYLSULFATE 10 MG/10ML IV SOLN
INTRAVENOUS | Status: AC
  Filled 2014-11-18: qty 1

## 2014-11-18 MED ORDER — OXYCODONE HCL 5 MG PO TABS
5.0000 mg | ORAL_TABLET | ORAL | Status: DC | PRN
  Administered 2014-11-18: 5 mg via ORAL
  Administered 2014-11-19: 10 mg via ORAL
  Administered 2014-11-19: 5 mg via ORAL
  Administered 2014-11-19 – 2014-11-21 (×6): 10 mg via ORAL
  Filled 2014-11-18: qty 2
  Filled 2014-11-18: qty 1
  Filled 2014-11-18: qty 2
  Filled 2014-11-18: qty 1
  Filled 2014-11-18 (×5): qty 2

## 2014-11-18 MED ORDER — MIDAZOLAM HCL 2 MG/2ML IJ SOLN
INTRAMUSCULAR | Status: DC | PRN
  Administered 2014-11-18: 1 mg via INTRAVENOUS

## 2014-11-18 MED ORDER — SODIUM CHLORIDE 0.9 % IJ SOLN: 3.0000 mL | INTRAMUSCULAR | Status: DC | PRN

## 2014-11-18 MED ORDER — 0.9 % SODIUM CHLORIDE (POUR BTL) OPTIME
TOPICAL | Status: DC | PRN
  Administered 2014-11-18: 3000 mL

## 2014-11-18 MED ORDER — FENTANYL CITRATE (PF) 100 MCG/2ML IJ SOLN
INTRAMUSCULAR | Status: DC | PRN
  Administered 2014-11-18: 150 ug via INTRAVENOUS
  Administered 2014-11-18 (×2): 50 ug via INTRAVENOUS

## 2014-11-18 MED ORDER — PHENYLEPHRINE 40 MCG/ML (10ML) SYRINGE FOR IV PUSH (FOR BLOOD PRESSURE SUPPORT)
PREFILLED_SYRINGE | INTRAVENOUS | Status: AC
  Filled 2014-11-18: qty 10

## 2014-11-18 MED ORDER — EPINEPHRINE HCL 1 MG/ML IJ SOLN
INTRAMUSCULAR | Status: AC
  Filled 2014-11-18: qty 1

## 2014-11-18 MED ORDER — METOPROLOL TARTRATE 25 MG/10 ML ORAL SUSPENSION
12.5000 mg | Freq: Two times a day (BID) | ORAL | Status: DC
  Filled 2014-11-18: qty 5

## 2014-11-18 MED ORDER — CHLORHEXIDINE GLUCONATE 4 % EX LIQD
60.0000 mL | Freq: Once | CUTANEOUS | Status: DC
Start: 2014-11-18 — End: 2014-11-18

## 2014-11-18 MED ORDER — ALBUMIN HUMAN 5 % IV SOLN
INTRAVENOUS | Status: DC | PRN
Start: 2014-11-18 — End: 2014-11-18
  Administered 2014-11-18 (×3): via INTRAVENOUS

## 2014-11-18 MED ORDER — GLYCOPYRROLATE 0.2 MG/ML IJ SOLN
INTRAMUSCULAR | Status: AC
  Filled 2014-11-18: qty 2

## 2014-11-18 MED ORDER — INSULIN ASPART 100 UNIT/ML ~~LOC~~ SOLN
0.0000 [IU] | SUBCUTANEOUS | Status: DC
  Administered 2014-11-18 – 2014-11-19 (×2): 2 [IU] via SUBCUTANEOUS

## 2014-11-18 MED ORDER — PROPOFOL 10 MG/ML IV BOLUS
INTRAVENOUS | Status: AC
  Filled 2014-11-18: qty 20

## 2014-11-18 MED ORDER — ONDANSETRON HCL 4 MG/2ML IJ SOLN
INTRAMUSCULAR | Status: DC | PRN
  Administered 2014-11-18: 4 mg via INTRAVENOUS

## 2014-11-18 MED ORDER — AMIODARONE IV BOLUS ONLY 150 MG/100ML
150.0000 mg | INTRAVENOUS | Status: DC
  Administered 2014-11-18: 150 mg via INTRAVENOUS
  Filled 2014-11-18: qty 100

## 2014-11-18 MED ORDER — ROSUVASTATIN CALCIUM 10 MG PO TABS
10.0000 mg | ORAL_TABLET | Freq: Every day | ORAL | Status: DC
  Administered 2014-11-18 – 2014-11-21 (×4): 10 mg via ORAL
  Filled 2014-11-18 (×6): qty 1

## 2014-11-18 MED ORDER — SODIUM CHLORIDE 0.9 % IV SOLN
INTRAVENOUS | Status: DC
  Filled 2014-11-18: qty 2.5

## 2014-11-18 MED ORDER — SODIUM CHLORIDE 0.9 % IV SOLN: 250.0000 mL | INTRAVENOUS | Status: DC | PRN

## 2014-11-18 MED ORDER — PHENYLEPHRINE HCL 10 MG/ML IJ SOLN
INTRAMUSCULAR | Status: DC | PRN
  Administered 2014-11-18 (×3): 80 ug via INTRAVENOUS

## 2014-11-18 MED ORDER — FENTANYL CITRATE (PF) 250 MCG/5ML IJ SOLN
INTRAMUSCULAR | Status: AC
  Filled 2014-11-18: qty 5

## 2014-11-18 MED ORDER — ROCURONIUM BROMIDE 100 MG/10ML IV SOLN
INTRAVENOUS | Status: DC | PRN
  Administered 2014-11-18: 50 mg via INTRAVENOUS

## 2014-11-18 MED ORDER — BUPIVACAINE HCL (PF) 0.25 % IJ SOLN
INTRAMUSCULAR | Status: AC
  Filled 2014-11-18: qty 30

## 2014-11-18 MED ORDER — SUCCINYLCHOLINE CHLORIDE 20 MG/ML IJ SOLN
INTRAMUSCULAR | Status: AC
  Filled 2014-11-18: qty 1

## 2014-11-18 MED ORDER — ACETAMINOPHEN 500 MG PO TABS
1000.0000 mg | ORAL_TABLET | Freq: Four times a day (QID) | ORAL | Status: DC
  Administered 2014-11-18 – 2014-11-19 (×4): 1000 mg via ORAL
  Filled 2014-11-18 (×7): qty 2

## 2014-11-18 MED ORDER — PROTAMINE SULFATE 10 MG/ML IV SOLN
INTRAVENOUS | Status: DC | PRN
  Administered 2014-11-18: 110 mg via INTRAVENOUS

## 2014-11-18 MED ORDER — CHLORHEXIDINE GLUCONATE 4 % EX LIQD: 60.0000 mL | Freq: Once | CUTANEOUS | Status: DC

## 2014-11-18 MED ORDER — LACTATED RINGERS IV SOLN
INTRAVENOUS | Status: DC | PRN
  Administered 2014-11-18: 07:00:00 via INTRAVENOUS

## 2014-11-18 MED ORDER — HEPARIN SODIUM (PORCINE) 1000 UNIT/ML IJ SOLN
INTRAMUSCULAR | Status: DC | PRN
  Administered 2014-11-18: 11000 [IU] via INTRAVENOUS

## 2014-11-18 MED ORDER — ASPIRIN EC 81 MG PO TBEC
81.0000 mg | DELAYED_RELEASE_TABLET | Freq: Every day | ORAL | Status: DC
  Administered 2014-11-19 – 2014-11-22 (×4): 81 mg via ORAL
  Filled 2014-11-18 (×4): qty 1

## 2014-11-18 MED ORDER — LACTATED RINGERS IV SOLN: 500.0000 mL | Freq: Once | INTRAVENOUS | Status: AC | PRN

## 2014-11-18 MED ORDER — PHENYLEPHRINE HCL 10 MG/ML IJ SOLN
0.0000 ug/min | INTRAVENOUS | Status: DC
  Filled 2014-11-18: qty 2

## 2014-11-18 MED ORDER — GLYCOPYRROLATE 0.2 MG/ML IJ SOLN
INTRAMUSCULAR | Status: DC | PRN
  Administered 2014-11-18: 0.4 mg via INTRAVENOUS

## 2014-11-18 MED ORDER — PROPOFOL 10 MG/ML IV BOLUS
INTRAVENOUS | Status: DC | PRN
  Administered 2014-11-18: 80 mg via INTRAVENOUS

## 2014-11-18 MED ORDER — TRAMADOL HCL 50 MG PO TABS: 50.0000 mg | ORAL_TABLET | ORAL | Status: DC | PRN

## 2014-11-18 MED ORDER — HEMOSTATIC AGENTS (NO CHARGE) OPTIME
TOPICAL | Status: DC | PRN
  Administered 2014-11-18 (×3): 1 via TOPICAL

## 2014-11-18 MED ORDER — CLOPIDOGREL BISULFATE 75 MG PO TABS
75.0000 mg | ORAL_TABLET | Freq: Every day | ORAL | Status: DC
  Administered 2014-11-19 – 2014-11-22 (×4): 75 mg via ORAL
  Filled 2014-11-18 (×5): qty 1

## 2014-11-18 MED ORDER — PROTAMINE SULFATE 10 MG/ML IV SOLN
INTRAVENOUS | Status: AC
  Filled 2014-11-18: qty 25

## 2014-11-18 MED ORDER — MIDAZOLAM HCL 2 MG/2ML IJ SOLN
INTRAMUSCULAR | Status: AC
  Filled 2014-11-18: qty 2

## 2014-11-18 MED ORDER — MORPHINE SULFATE 2 MG/ML IJ SOLN
2.0000 mg | INTRAMUSCULAR | Status: DC | PRN
  Administered 2014-11-18 – 2014-11-19 (×5): 2 mg via INTRAVENOUS
  Filled 2014-11-18 (×5): qty 1

## 2014-11-18 MED ORDER — SODIUM CHLORIDE 0.9 % IV SOLN
1.0000 mL/kg/h | INTRAVENOUS | Status: AC
  Administered 2014-11-18: 1 mL/kg/h via INTRAVENOUS

## 2014-11-18 MED ORDER — NOREPINEPHRINE BITARTRATE 1 MG/ML IV SOLN
4000.0000 ug | INTRAVENOUS | Status: DC | PRN
  Administered 2014-11-18: 2 ug/min via INTRAVENOUS

## 2014-11-18 MED ORDER — MIDAZOLAM HCL 2 MG/2ML IJ SOLN: 2.0000 mg | INTRAMUSCULAR | Status: DC | PRN

## 2014-11-18 MED ORDER — LACTATED RINGERS IV SOLN
INTRAVENOUS | Status: DC | PRN
  Administered 2014-11-18: 08:00:00 via INTRAVENOUS

## 2014-11-18 MED ORDER — LIDOCAINE HCL (CARDIAC) 20 MG/ML IV SOLN
INTRAVENOUS | Status: AC
  Filled 2014-11-18: qty 5

## 2014-11-18 MED ORDER — HYDROXYCHLOROQUINE SULFATE 200 MG PO TABS
200.0000 mg | ORAL_TABLET | Freq: Every day | ORAL | Status: DC
  Administered 2014-11-19 – 2014-11-22 (×4): 200 mg via ORAL
  Filled 2014-11-18 (×5): qty 1

## 2014-11-18 MED ORDER — EPINEPHRINE HCL 1 MG/ML IJ SOLN
INTRAMUSCULAR | Status: DC | PRN
  Administered 2014-11-18: 1 mg

## 2014-11-18 MED ORDER — PANTOPRAZOLE SODIUM 40 MG PO TBEC
40.0000 mg | DELAYED_RELEASE_TABLET | Freq: Every day | ORAL | Status: DC
  Administered 2014-11-19 – 2014-11-22 (×4): 40 mg via ORAL
  Filled 2014-11-18 (×4): qty 1

## 2014-11-18 MED ORDER — BUPIVACAINE HCL (PF) 0.25 % IJ SOLN
INTRAMUSCULAR | Status: DC | PRN
  Administered 2014-11-18: 30 mL

## 2014-11-18 MED ORDER — CILOSTAZOL 100 MG PO TABS
100.0000 mg | ORAL_TABLET | Freq: Every day | ORAL | Status: DC
  Administered 2014-11-19 – 2014-11-20 (×2): 100 mg via ORAL
  Filled 2014-11-18 (×2): qty 1

## 2014-11-18 MED ORDER — MORPHINE SULFATE 2 MG/ML IJ SOLN: 1.0000 mg | INTRAMUSCULAR | Status: AC | PRN

## 2014-11-18 MED ORDER — DEXTROSE 5 % IV SOLN
1.5000 g | Freq: Two times a day (BID) | INTRAVENOUS | Status: DC
  Administered 2014-11-18 – 2014-11-19 (×2): 1.5 g via INTRAVENOUS
  Filled 2014-11-18 (×3): qty 1.5

## 2014-11-18 MED ORDER — METOPROLOL TARTRATE 12.5 MG HALF TABLET
12.5000 mg | ORAL_TABLET | Freq: Two times a day (BID) | ORAL | Status: DC
  Filled 2014-11-18: qty 1

## 2014-11-18 MED ORDER — SODIUM CHLORIDE 0.9 % IV SOLN: Freq: Once | INTRAVENOUS | Status: DC

## 2014-11-18 MED ORDER — SODIUM CHLORIDE 0.9 % IJ SOLN
INTRAMUSCULAR | Status: DC | PRN
  Administered 2014-11-18: 10 mL via INTRAVENOUS

## 2014-11-18 MED ORDER — CARBIDOPA-LEVODOPA 25-100 MG PO TABS
1.0000 | ORAL_TABLET | Freq: Three times a day (TID) | ORAL | Status: DC
  Administered 2014-11-18 – 2014-11-22 (×14): 1 via ORAL
  Filled 2014-11-18 (×21): qty 1

## 2014-11-18 MED ORDER — ONDANSETRON HCL 4 MG/2ML IJ SOLN
INTRAMUSCULAR | Status: AC
  Filled 2014-11-18: qty 2

## 2014-11-18 MED ORDER — EPHEDRINE SULFATE 50 MG/ML IJ SOLN
INTRAMUSCULAR | Status: AC
  Filled 2014-11-18: qty 1

## 2014-11-18 MED ORDER — LIDOCAINE HCL (CARDIAC) 20 MG/ML IV SOLN
INTRAVENOUS | Status: DC | PRN
  Administered 2014-11-18: 40 mg via INTRAVENOUS

## 2014-11-18 MED ORDER — SODIUM CHLORIDE 0.9 % IJ SOLN: 3.0000 mL | Freq: Two times a day (BID) | INTRAMUSCULAR | Status: DC

## 2014-11-18 MED ORDER — ONDANSETRON HCL 4 MG/2ML IJ SOLN
4.0000 mg | Freq: Four times a day (QID) | INTRAMUSCULAR | Status: DC | PRN
  Administered 2014-11-18 – 2014-11-19 (×2): 4 mg via INTRAVENOUS
  Filled 2014-11-18 (×2): qty 2

## 2014-11-18 MED ORDER — ROCURONIUM BROMIDE 50 MG/5ML IV SOLN
INTRAVENOUS | Status: AC
  Filled 2014-11-18: qty 1

## 2014-11-18 MED ORDER — NEOSTIGMINE METHYLSULFATE 10 MG/10ML IV SOLN
INTRAVENOUS | Status: DC | PRN
  Administered 2014-11-18: 3 mg via INTRAVENOUS

## 2014-11-18 MED ORDER — ACETAMINOPHEN 160 MG/5ML PO SOLN
1000.0000 mg | Freq: Four times a day (QID) | ORAL | Status: DC
  Filled 2014-11-18: qty 40

## 2014-11-18 MED ORDER — VANCOMYCIN HCL IN DEXTROSE 1-5 GM/200ML-% IV SOLN
1000.0000 mg | Freq: Once | INTRAVENOUS | Status: AC
  Administered 2014-11-18: 1000 mg via INTRAVENOUS
  Filled 2014-11-18: qty 200

## 2014-11-18 MED ORDER — SODIUM CHLORIDE 0.9 % IV SOLN: INTRAVENOUS | Status: DC

## 2014-11-18 MED ORDER — NITROGLYCERIN IN D5W 200-5 MCG/ML-% IV SOLN: 0.0000 ug/min | INTRAVENOUS | Status: DC

## 2014-11-18 MED ORDER — DEXMEDETOMIDINE HCL IN NACL 200 MCG/50ML IV SOLN
0.1000 ug/kg/h | INTRAVENOUS | Status: DC
  Administered 2014-11-18: 0.2 ug/kg/h via INTRAVENOUS

## 2014-11-18 MED ORDER — SODIUM CHLORIDE 0.9 % IR SOLN
Status: DC | PRN
  Administered 2014-11-18 (×3): 500 mL

## 2014-11-18 MED ORDER — IODIXANOL 320 MG/ML IV SOLN
INTRAVENOUS | Status: DC | PRN
  Administered 2014-11-18: 39.9 mL via INTRAVENOUS

## 2014-11-18 MED ORDER — ACETAMINOPHEN 650 MG RE SUPP: 650.0000 mg | Freq: Once | RECTAL | Status: DC

## 2014-11-18 SURGICAL SUPPLY — 108 items
BAG DECANTER FOR FLEXI CONT (MISCELLANEOUS) ×4 IMPLANT
BAG SNAP BAND KOVER 36X36 (MISCELLANEOUS) ×8 IMPLANT
BATTERY PACK STR FOR DRIVER (MISCELLANEOUS) ×4 IMPLANT
BLADE OSCILLATING /SAGITTAL (BLADE) IMPLANT
BLADE STERNUM SYSTEM 6 (BLADE) ×4 IMPLANT
CABLE PACING FASLOC BIEGE (MISCELLANEOUS) IMPLANT
CABLE PACING FASLOC BLUE (MISCELLANEOUS) ×4 IMPLANT
CANNULA FEM VENOUS REMOTE 22FR (CANNULA) IMPLANT
CANNULA OPTISITE PERFUSION 16F (CANNULA) IMPLANT
CATH ACCU-VU SIZ PIG 5F 100CM (CATHETERS) ×4 IMPLANT
CATH ACCU-VU SIZ PIG 5F 70CM (CATHETERS) ×4 IMPLANT
CATH ANGIO 5F BER2 65CM (CATHETERS) ×4 IMPLANT
CATH S G BIP PACING (SET/KITS/TRAYS/PACK) ×4 IMPLANT
CLIP TI MEDIUM 24 (CLIP) ×4 IMPLANT
CLIP TI WIDE RED SMALL 24 (CLIP) ×4 IMPLANT
CONN ST 1/4X3/8  BEN (MISCELLANEOUS) ×2
CONN ST 1/4X3/8 BEN (MISCELLANEOUS) ×2 IMPLANT
CONT SPEC 4OZ CLIKSEAL STRL BL (MISCELLANEOUS) ×4 IMPLANT
COVER BACK TABLE 24X17X13 BIG (DRAPES) ×4 IMPLANT
COVER BACK TABLE 60X90IN (DRAPES) ×4 IMPLANT
COVER DOME SNAP 22 D (MISCELLANEOUS) ×8 IMPLANT
COVER MAYO STAND STRL (DRAPES) ×4 IMPLANT
CRADLE DONUT ADULT HEAD (MISCELLANEOUS) ×4 IMPLANT
DERMABOND ADVANCED (GAUZE/BANDAGES/DRESSINGS) ×2
DERMABOND ADVANCED .7 DNX12 (GAUZE/BANDAGES/DRESSINGS) ×2 IMPLANT
DRAIN CHANNEL 28F RND 3/8 FF (WOUND CARE) IMPLANT
DRAPE INCISE IOBAN 66X45 STRL (DRAPES) IMPLANT
DRAPE SLUSH/WARMER DISC (DRAPES) ×4 IMPLANT
DRAPE SURG IRRIG POUCH 19X23 (DRAPES) ×4 IMPLANT
DRAPE TABLE COVER HEAVY DUTY (DRAPES) ×4 IMPLANT
DRILL BIT 2.2MM W/10M STOP (BIT) ×4 IMPLANT
DRILL BIT 2.2MM W/12M STOP (BIT) ×4 IMPLANT
ELECT BLADE 4.0 EZ CLEAN MEGAD (MISCELLANEOUS)
ELECT BLADE 6.5 EXT (BLADE) ×4 IMPLANT
ELECT REM PT RETURN 9FT ADLT (ELECTROSURGICAL) ×8
ELECTRODE BLDE 4.0 EZ CLN MEGD (MISCELLANEOUS) IMPLANT
ELECTRODE REM PT RTRN 9FT ADLT (ELECTROSURGICAL) ×4 IMPLANT
GAUZE SPONGE 4X4 12PLY STRL (GAUZE/BANDAGES/DRESSINGS) ×4 IMPLANT
GLOVE BIOGEL M 6.5 STRL (GLOVE) ×12 IMPLANT
GLOVE BIOGEL M STRL SZ7.5 (GLOVE) ×4 IMPLANT
GLOVE BIOGEL PI IND STRL 6 (GLOVE) ×2 IMPLANT
GLOVE BIOGEL PI IND STRL 6.5 (GLOVE) ×2 IMPLANT
GLOVE BIOGEL PI IND STRL 7.0 (GLOVE) ×2 IMPLANT
GLOVE BIOGEL PI IND STRL 7.5 (GLOVE) ×4 IMPLANT
GLOVE BIOGEL PI INDICATOR 6 (GLOVE) ×2
GLOVE BIOGEL PI INDICATOR 6.5 (GLOVE) ×2
GLOVE BIOGEL PI INDICATOR 7.0 (GLOVE) ×2
GLOVE BIOGEL PI INDICATOR 7.5 (GLOVE) ×4
GLOVE ECLIPSE 7.5 STRL STRAW (GLOVE) ×4 IMPLANT
GLOVE ECLIPSE 8.0 STRL XLNG CF (GLOVE) ×4 IMPLANT
GLOVE EUDERMIC 7 POWDERFREE (GLOVE) ×4 IMPLANT
GLOVE ORTHO TXT STRL SZ7.5 (GLOVE) ×4 IMPLANT
GOWN STRL REUS W/ TWL LRG LVL3 (GOWN DISPOSABLE) ×8 IMPLANT
GOWN STRL REUS W/ TWL XL LVL3 (GOWN DISPOSABLE) ×6 IMPLANT
GOWN STRL REUS W/TWL LRG LVL3 (GOWN DISPOSABLE) ×8
GOWN STRL REUS W/TWL XL LVL3 (GOWN DISPOSABLE) ×6
GUIDEWIRE AMPLATZ STIFF 0.35 (WIRE) ×4 IMPLANT
HEMOSTAT SURGICEL 2X14 (HEMOSTASIS) ×4 IMPLANT
INSERT FOGARTY 61MM (MISCELLANEOUS) ×4 IMPLANT
KIT BASIN OR (CUSTOM PROCEDURE TRAY) ×4 IMPLANT
KIT HEART LEFT (KITS) ×4 IMPLANT
KIT ROOM TURNOVER OR (KITS) ×4 IMPLANT
KIT SUCTION CATH 14FR (SUCTIONS) ×12 IMPLANT
LEAD PACING MYOCARDI (MISCELLANEOUS) ×4 IMPLANT
NEEDLE 18GX1X1/2 (RX/OR ONLY) (NEEDLE) ×4 IMPLANT
NEEDLE 22X1 1/2 (OR ONLY) (NEEDLE) ×4 IMPLANT
NS IRRIG 1000ML POUR BTL (IV SOLUTION) ×20 IMPLANT
PACK AORTA (CUSTOM PROCEDURE TRAY) ×4 IMPLANT
PAD ARMBOARD 7.5X6 YLW CONV (MISCELLANEOUS) ×8 IMPLANT
PAD ELECT DEFIB RADIOL ZOLL (MISCELLANEOUS) ×4 IMPLANT
PLATE RT RIBS 8 AND 9 18 HOLE (Plate) ×4 IMPLANT
SCREW SELF TAP MAT 2.9X12MM (Screw) ×20 IMPLANT
SCREW TITAN MATX RIB LOCK 10MM (Screw) ×4 IMPLANT
SHEATH PINNACLE 6F 10CM (SHEATH) ×8 IMPLANT
SPONGE GAUZE 4X4 12PLY STER LF (GAUZE/BANDAGES/DRESSINGS) ×8 IMPLANT
SPONGE LAP 4X18 X RAY DECT (DISPOSABLE) ×4 IMPLANT
SPONGE SURGIFOAM ABS GEL 100 (HEMOSTASIS) IMPLANT
STOPCOCK 4 WAY LG BORE MALE ST (IV SETS) ×4 IMPLANT
STOPCOCK MORSE 400PSI 3WAY (MISCELLANEOUS) ×4 IMPLANT
SUT BONE WAX W31G (SUTURE) ×4 IMPLANT
SUT ETHIBOND X763 2 0 SH 1 (SUTURE) ×16 IMPLANT
SUT PROLENE 3 0 SH1 36 (SUTURE) ×12 IMPLANT
SUT PROLENE 4 0 RB 1 (SUTURE)
SUT PROLENE 4-0 RB1 .5 CRCL 36 (SUTURE) IMPLANT
SUT PROLENE 5 0 C 1 36 (SUTURE) IMPLANT
SUT PROLENE 6 0 C 1 30 (SUTURE) ×4 IMPLANT
SUT SILK  1 MH (SUTURE) ×4
SUT SILK 1 MH (SUTURE) ×4 IMPLANT
SUT SILK 2 0 SH CR/8 (SUTURE) ×4 IMPLANT
SUT TEM PAC WIRE 2 0 SH (SUTURE) ×4 IMPLANT
SUT VIC AB 1 CTX 27 (SUTURE) IMPLANT
SUT VIC AB 2-0 CTX 36 (SUTURE) IMPLANT
SUT VIC AB 2-0 UR6 27 (SUTURE) ×4 IMPLANT
SUT VIC AB 3-0 X1 27 (SUTURE) ×4 IMPLANT
SYR 20CC LL (SYRINGE) ×12 IMPLANT
SYR CONTROL 10ML LL (SYRINGE) ×8 IMPLANT
SYR MEDRAD MARK V 150ML (SYRINGE) ×4 IMPLANT
SYRINGE 10CC LL (SYRINGE) ×8 IMPLANT
SYSTEM SAHARA CHEST DRAIN ATS (WOUND CARE) ×4 IMPLANT
TAPE CLOTH SURG 4X10 WHT LF (GAUZE/BANDAGES/DRESSINGS) ×8 IMPLANT
TOWEL OR 17X26 10 PK STRL BLUE (TOWEL DISPOSABLE) ×8 IMPLANT
TRANSDUCER W/STOPCOCK (MISCELLANEOUS) ×4 IMPLANT
TRAY FOLEY IC TEMP SENS 16FR (CATHETERS) ×4 IMPLANT
TUBING ART PRESS 72  MALE/FEM (TUBING) ×2
TUBING ART PRESS 72 MALE/FEM (TUBING) ×2 IMPLANT
VALVE HRT TRANSCATH ASCEND 29M (Valve) ×4 IMPLANT
WIRE AMPLATZ SS-J .035X180CM (WIRE) ×4 IMPLANT
WIRE J 3MM .035X145CM (WIRE) ×4 IMPLANT

## 2014-11-18 NOTE — Op Note (Signed)
CARDIOTHORACIC SURGERY OPERATIVE NOTE  Date of Procedure:  11/18/2014  Preoperative Diagnosis: Severe Aortic Stenosis   Postoperative Diagnosis: Same   Procedure:   Transcatheter Aortic Valve Replacement - Transaortic Approach  Edwards Sapien XT THV (size 29 mm, model # 9300TFX, serial # [id number])   Co-Surgeons: Gaye Pollack, MD and Sherren Mocha, MD  Anesthesiologist:  Roberts Gaudy MD  Echocardiographer:  Jenkins Rouge MD  Pre-operative Echo Findings:  Severe aortic stenosis  Normal left ventricular systolic function  Moderate AI  Post-operative Echo Findings:  Mild paravalvular leak  Normal left ventricular systolic function  Normal transvalvular gradients  BRIEF CLINICAL NOTE AND INDICATIONS FOR SURGERY Patient is a 80 year old male with history of aortic stenosis, hypertension, hyperlipidemia, cerebrovascular disease, peripheral vascular disease, renal artery stenosis, rheumatoid arthritis, and chronic tremors who has been referred for a second surgical opinion to discuss treatment options for management of severe symptomatic aortic stenosis. The patient has had known history of heart murmur dating back several years for which she has been followed by Dr. Rockey Situ with serial echocardiograms. Previous echocardiograms had suggested the presence of moderate aortic stenosis. Last summer the patient suffered a near syncopal episode while he was preparing his lawnmower. Initially he attributed this to the heat. This past 8 years the patient suffered another near syncopal episode while he was helping his wife cook a meal for the family. He underwent a follow-up echocardiogram 05/23/2014 that demonstrated the presence of severe aortic stenosis. Left ventricular systolic function was preserved. Since then he has developed progressive symptoms of exertional shortness of breath and fatigue. Transesophageal echocardiogram was performed 09/01/2014 and confirmed the presence of severe  aortic stenosis. The patient subsequent underwent diagnostic cardiac catheterization on 09/18/2014. The patient was found to have nonobstructive coronary artery disease, although the ostial right coronary artery was not visualized well. Transvalvular gradient was not assessed at the time of catheterization. The patient was referred for surgical consultation and evaluated by Dr. Cyndia Bent on 09/30/2014. He noted the presence of significant calcification involving the aorta with complex abdominal aortic stent graft, left Gore-Tex femoral-popliteal bypass graft, moderate carotid artery stenosis, and stenosis of the innominate and right subclavian and left subclavian arteries. Because of this and the patient's numerous associated comorbid medical problems he was felt to be high risk for conventional surgery.  During the course of the patient's preoperative work up they have been evaluated comprehensively by a multidisciplinary team of specialists coordinated through the [facility] in the [facility].  They have been demonstrated to suffer from symptomatic severe aortic stenosis as noted above. The patient has been counseled extensively as to the relative risks and benefits of all options for the treatment of severe aortic stenosis including long term medical therapy, conventional surgery for aortic valve replacement, and transcatheter aortic valve replacement.  The patient has been independently evaluated by two cardiac surgeons including Dr Cyndia Bent and Dr Roxy Manns, and they are felt to be at high risk for conventional surgical aortic valve replacement based upon a predicted risk of mortality using the Society of Thoracic Surgeons risk calculator of 5.0% with both surgeons agreeing that the patient would be a poor candidate for conventional surgery (predicted risk of mortality >15% and/or predicted risk of permanent morbidity >50%) because of comorbidities including severe  vascular disease, enlarging AAA sac after EVAR, and chronic immunosuppression for with methotrexate for treatment of rheumatoid arthritis .   Based upon review of all of the patient's preoperative diagnostic  tests they are felt to be candidate for transcatheter aortic valve replacement using the transaortic approach as an alternative to high risk conventional surgery.    Following the decision to proceed with transcatheter aortic valve replacement, a discussion has been held regarding what types of management strategies would be attempted intraoperatively in the event of life-threatening complications, including whether or not the patient would be considered a candidate for the use of cardiopulmonary bypass and/or conversion to open sternotomy for attempted surgical intervention.  The patient has been advised of a variety of complications that might develop peculiar to this approach including but not limited to risks of death, stroke, paravalvular leak, aortic dissection or other major vascular complications, aortic annulus rupture, device embolization, cardiac rupture or perforation, acute myocardial infarction, arrhythmia, heart block or bradycardia requiring permanent pacemaker placement, congestive heart failure, respiratory failure, renal failure, pneumonia, infection, other late complications related to structural valve deterioration or migration, or other complications that might ultimately cause a temporary or permanent loss of functional independence or other long term morbidity.  The patient provides full informed consent for the procedure as described and all questions were answered preoperatively.    DETAILS OF THE OPERATIVE PROCEDURE  PREPARATION:   The patient is brought to the operating room on the above mentioned date and central monitoring was established by the anesthesia team including placement of Swan-Ganz catheter and radial arterial line. The patient is placed in the supine position  on the operating table.  Intravenous antibiotics are administered. General endotracheal anesthesia is induced uneventfully. A Foley catheter is placed.  Baseline transesophageal echocardiogram was performed.   The patient's chest, abdomen, both groins, and both lower extremities are prepared and draped in a sterile manner. A time out procedure is performed.  PERIPHERAL ACCESS:   Femoral arterial and venous access is obtained with placement of 6 Fr arterial and venous sheaths on the right side.  A pigtail diagnostic catheter is passed through the right femoral arterial sheath under fluoroscopic guidance into the aortic root.  Caution was taken to navigate the patient's endograft. A temporary transvenous pacemaker catheter is passed through the right femoral venous sheath under fluoroscopic guidance into the right ventricle.  The pacemaker is tested to ensure stable lead placement and pacemaker capture.  TRANSAORTIC ACCESS:  See Dr Vivi Martens note for details.  The patient is heparinized systemically and ACT verified > 250 seconds.  The ascending aorta is punctured using an  18 gauge needle and a soft J-tipped guidewire is passed into the aortic root under fluoroscopic guidance.  A 6 Fr sheath is placed over the guidewire a straight tip wire is directed across the aortic valve.  Simultaneous LV and Ao pressures are recorded.  An Amplatz extra stiff guidewire is passed through the [place] catheter into the LV apex.  A 24 French Edwards Ascendra 3 introducer sheath is passed over the guidewire into the aorta.  The sheath position is continuously monitored and secured at 3 cm.    BALLOON AORTIC VALVULOPLASTY: An Edwards Ascendra BAV catheter (48mm x 3 cm balloon)  is advanced over the guidewire and through the introducer sheath across the aortic valve.  The position of the BAV catheter is verified flouroscopically with approximately 50% of the balloon length above and below the plane of the aortic  annulus.  Balloon aortic valvuloplasty is performed while temporarily holding ventilation and during rapid ventricular pacing to maintain systolic blood pressure < 50 mmHg and pulse pressure < 10 mmHg.  The balloon  is removed and TEE reassessed for valve and left ventricular function.    The patient's hemodynamic recovery following BAV is good.  TRANSCATHETER HEART VALVE DEPLOYMENT: An Edwards Sapien XT transcatheter heart valve (size 29 mm) is prepared and crimped per manufacturer's guidelines, and the proper orientation of the valve is confirmed on the [employer] 3 delivery system.  The delivery system loader is advanced into the introducing sheath.  The valve and delivery system are advanced through the loader into the sheath and all air is evacuated.  The valve and balloon are advanced into the left ventricle and part way through the aortic valve.  The valve pusher is retracted.  The valve is then finely positioned in the aortic valve and its position verified using a power-injection aortogram while temporarily holding ventilation and rapid pacing.  Valve position is also confirmed using TEE, and care is taken to make certain there is no sign of entanglement in the mitral apparatus.  Once final position of the valve has been confirmed, the valve is deployed while temporarily holding ventilation and during rapid ventricular pacing to maintain systolic blood pressure < 50 mmHg and pulse pressure < 10 mmHg.  The balloon inflation is held for >3 seconds after reaching full deployment volume.  Once the balloon has fully deflated the balloon is retracted into the left ventricle and valve function is assessed using TEE.   There is felt to be 2+ paravalvular leak and mild central aortic insufficiency.  Left ventricular function is  unchanged from preoperatively.  There is no change in  mitral regurgitation.  The patient's hemodynamic recovery following valve deployment is good.  Post-deployment dilatation  is performed using the deployment balloon with 1 mL extra volume added to the balloon during rapid pacing.  Following post-deployment dilatation the degree of paravalvular leak noted by TEE was mild.  The deployment balloon and guidewire are both removed and simultaneous left ventricular and aortic pressures are recorded.  PROCEDURE COMPLETION: See note of Dr Cyndia Bent with details of aortic repair. The temporary pacemaker, pigtail catheters and all femoral sheaths are removed.  The patient tolerated the procedure well and is transported to the surgical intensive care in stable condition. There are no intraoperative complications. All sponge instrument and needle counts are verified correct at completion of the operation.  The patient received a total of 40 mL of Visipaque contrast during the procedure.  Blane Ohara, MD  11/18/2014 10:29 AM

## 2014-11-18 NOTE — Progress Notes (Signed)
Patient ID: Chase Bell., male   DOB: 01/21/1935, 80 y.o.   MRN: [id number]  SICU Evening Rounds:  Hemodynamically stable in sinus rhythm 60's but tends to get hypertensive.  Has been neurologically stable  Urine output good  CT output low.  Will add prn hydralazine for BP hypertension. Avoid BB with HR 60's.

## 2014-11-18 NOTE — Interval H&P Note (Signed)
History and Physical Interval Note:  11/18/2014 6:48 AM  Chase Leland Her Sr.  has presented today for surgery, with the diagnosis of SEVERE AS  The various methods of treatment have been discussed with the patient and family. After consideration of risks, benefits and other options for treatment, the patient has consented to  Procedure(s): TRANSCATHETER AORTIC VALVE REPLACEMENT, TRANSAORTIC (N/A) TRANSESOPHAGEAL ECHOCARDIOGRAM (TEE) (N/A) as a surgical intervention .  The patient's history has been reviewed, patient examined, no change in status, stable for surgery.  I have reviewed the patient's chart and labs.  Questions were answered to the patient's satisfaction.     Gaye Pollack

## 2014-11-18 NOTE — Progress Notes (Signed)
Pulse 46 this am.  Metoprolol held.

## 2014-11-18 NOTE — Progress Notes (Signed)
  Echocardiogram Echocardiogram Transesophageal has been performed.  Donata Clay 11/18/2014, 10:18 AM

## 2014-11-18 NOTE — Anesthesia Preprocedure Evaluation (Signed)
Anesthesia Evaluation  Patient identified by MRN, date of birth, ID band Patient awake    Reviewed: Allergy & Precautions, NPO status , Patient's Chart, lab work & pertinent test results  Airway Mallampati: II  TM Distance: >3 FB Neck ROM: Full    Dental  (+) Edentulous Upper, Edentulous Lower   Pulmonary former smoker,  breath sounds clear to auscultation        Cardiovascular hypertension, Rhythm:Irregular Rate:Normal     Neuro/Psych    GI/Hepatic   Endo/Other  diabetes  Renal/GU      Musculoskeletal   Abdominal   Peds  Hematology   Anesthesia Other Findings   Reproductive/Obstetrics                             Anesthesia Physical Anesthesia Plan  ASA: III  Anesthesia Plan: General   Post-op Pain Management:    Induction: Intravenous  Airway Management Planned: Oral ETT  Additional Equipment: Arterial line and CVP  Intra-op Plan:   Post-operative Plan:   Informed Consent: I have reviewed the patients History and Physical, chart, labs and discussed the procedure including the risks, benefits and alternatives for the proposed anesthesia with the patient or authorized representative who has indicated his/her understanding and acceptance.     Plan Discussed with: CRNA and Anesthesiologist  Anesthesia Plan Comments:         Anesthesia Quick Evaluation

## 2014-11-18 NOTE — Anesthesia Postprocedure Evaluation (Signed)
  Anesthesia Post-op Note  Patient: Chase M Slovacek Sr.  Procedure(s) Performed: Procedure(s): TRANSCATHETER AORTIC VALVE REPLACEMENT, TRANSAORTIC (N/A) TRANSESOPHAGEAL ECHOCARDIOGRAM (TEE) (N/A)  Patient Location: SICU  Anesthesia Type:General  Level of Consciousness: awake and oriented  Airway and Oxygen Therapy: Patient Spontanous Breathing and Patient connected to nasal cannula oxygen  Post-op Pain: none  Post-op Assessment: Post-op Vital signs reviewed, Patient's Cardiovascular Status Stable, Respiratory Function Stable, Patent Airway and Pain level controlled              Post-op Vital Signs: stable  Last Vitals:  Filed Vitals:   11/18/14 1245  BP: 99/52  Pulse: 60  Temp:   Resp: 18    Complications: No apparent anesthesia complications

## 2014-11-18 NOTE — H&P (Signed)
[address]       [place] [address]             [phone]      Cardiothoracic Surgery History and Physical  Referring Provider is Rockey Situ, Kathlene November, MD PCP is Juluis Pitch, MD  Chief Complaint  Patient presents with  . Aortic Stenosis    eval for TAVR    HPI:  The patient is a 80 year old vasculopath with a history of AODM, hyperlipidemia, remote smoking, rheumatoid arthritis on methotrexate for years and aortic stenosis. He underwent AAA repair with a stent graft approximately 6 years ago and then repair of a type 2 endoleak in 05/1013. He underwent right CEA in 09/2012. His most recent carotid ultrasound on 04/25/2014 showed a patent right internal carotid. There was a 50-69% left internal carotid artery stenosis and there were elevated velocities consistent with stenosis of the right subclavian or innominate artery and the left subclavian artery. This has been followed by Dr. Lucky Cowboy in [place]. He reports an episode of near syncope last summer while repairing his lawn mower and then again in January 2016 after helping his wife cook a New Years meal. With both of these episodes he felt terrible and weak He reports having progressive exertional shortness of breath and fatigue but usually only happens with heavy exertion. He can do his normal daily activities around the house without difficulty but his wife says that he sleeps a lot. He denies any chest pain, orthopnea, or PND. He recently underwent cardiac cath by Dr. Rockey Situ showing mild non-obstructive coronary disease. The aortic valve was not crossed.   The patient is retired and lives with his wife in [place]. He is fairly sedentary and does not exercise but does play golf.  Past Medical History  Diagnosis Date  . PVD (peripheral vascular disease)     with abdominal aortic aneurysm and claudication, s/p repair.  Marland Kitchen Hyperlipidemia   . Hypertension   . Valvular heart  disease     moderate to severe aortic stenosis  . AAA (abdominal aortic aneurysm)     s/p repair/stent placement  . Heart murmur   . Carotid artery occlusion 2014  . Renal artery stenosis   . Diabetes mellitus without complication   . Basal cell carcinoma   . Kidney stones   . Bilateral cataracts   . Nephrolithiasis   . Tremor   . History of alcoholism   . History of seizures   . Anemia   . Osteoarthritis   . Rheumatoid arthritis   . Anemia   . Coronary artery disease     Past Surgical History  Procedure Laterality Date  . Femoral bypass      left leg  . Cataract extraction, bilateral    . Hernia repair  1996  . Abdominal aortic aneurysm repair  08/29/2007    stent graft repair  . Cystoscopy    . Carotid endarterectomy  2014    right   . Carotid endarterectomy    . Abdominal aortic aneurysm repair    . Cardiac catheterization N/A 09/18/2014    Procedure: Left Heart Cath; Surgeon: Minna Merritts, MD; Location: [facility]; Service: Cardiovascular; Laterality: N/A;    Family History  Problem Relation Age of Onset  . Heart disease Brother     History   Social History  . Marital Status: Married    Spouse Name: N/A  . Number of Children:  N/A  . Years of Education: N/A   Occupational History  . Not on file.   Social History Main Topics  . Smoking status: Former Smoker -- 1.00 packs/day for 50 years    Types: Cigarettes    Quit date: 04/16/2005  . Smokeless tobacco: Current User    Types: Chew  . Alcohol Use: No  . Drug Use: No  . Sexual Activity: Not on file   Other Topics Concern  . Not on file   Social History Narrative    Current Outpatient Prescriptions  Medication Sig Dispense Refill  . aspirin 81 MG tablet Take 81 mg by mouth daily.      . carbidopa-levodopa (SINEMET IR) 25-100 MG per tablet Take 1 tablet by mouth daily.     . cilostazol (PLETAL) 100 MG tablet Take 100 mg by mouth daily.     Marland Kitchen esomeprazole (NEXIUM) 20 MG capsule TAKE ONE CAPSULE BY MOUTH TWICE DAILY    . ferrous fumarate (HEMOCYTE - 106 MG FE) 325 (106 FE) MG TABS tablet Take 1 tablet by mouth daily.    . folic acid (FOLVITE) 1 MG tablet Take 1 mg by mouth daily.    . hydroxychloroquine (PLAQUENIL) 200 MG tablet Take by mouth daily.    Marland Kitchen lisinopril (PRINIVIL,ZESTRIL) 20 MG tablet Take 20 mg by mouth daily.    . methotrexate (RHEUMATREX) 2.5 MG tablet Take 6 tablets weekly.    . rosuvastatin (CRESTOR) 20 MG tablet Take 20 mg by mouth daily.     No current facility-administered medications for this visit.    Allergies  Allergen Reactions  . Simvastatin     Myalgia       Review of Systems:  General:normal appetite, decreased energy, no weight gain, no weight loss, no fever Cardiac:no chest pain with exertion, no chest pain at rest, moderate SOB with heavy exertion, no resting SOB, no PND, no orthopnea, no palpitations, no arrhythmia, no atrial fibrillation, no LE edema, two dizzy spells with near syncope Respiratory: no home oxygen, no productive cough, no dry cough, no bronchitis, no wheezing, no hemoptysis, no asthma, no pain with inspiration or cough, no sleep apnea, no CPAP at night GI:no difficulty swallowing, has reflux, no frequent heartburn, no hiatal hernia, no abdominal pain, no constipation, no diarrhea, no hematochezia, no hematemesis, no melena GU:no dysuria, no frequency, no urinary tract infection, no hematuria, no enlarged prostate, has kidney stones, no kidney  disease Vascular:has pain suggestive of claudication, no pain in feet, no leg cramps, no varicose veins, no DVT, no non-healing foot ulcer Neuro:no stroke, no TIA's, no seizures, no headaches, notemporary blindness one eye, no slurred speech, no peripheral neuropathy, no chronic pain, no instability of gait, no memory/cognitive dysfunction Musculoskeletal:has arthritis, has joint swelling, no myalgias, no difficulty walking, decreased mobility  Skin:no rash, no itching, no skin infections, no pressure sores or ulcerations Psych:no anxiety, no depression, no nervousness, no unusual recent stress Eyes:no blurry vision, no floaters, no recent vision changes, wears glasses or contacts ENT:has hearing loss, no loose or painful teeth, wears dentures, last saw dentist recently. He had all of his remaining teeth removed and has an upper denture. Lower denture no made yet Hematologic:has easy bruising, no abnormal bleeding, no clotting disorder, no frequent epistaxis Endocrine:has diabetes, does check CBG's at home periodically.    Physical Exam:  BP 168/62 mmHg  Pulse 46  Temp(Src) 97.8 F (36.6 C) (Oral)  Resp 20  Wt 74.39 kg (164 lb)  SpO2 100%  General:Elderly but well-appearing HEENT:Unremarkable , [place]/AT, PERRLA, EOMI, oropharynx clear. Neck:no JVD, no bruits, no adenopathy or thyromegaly Chest:clear to auscultation, symmetrical breath sounds, no wheezes, no rhonchi   TK:WIOXBDZHG irregular rhythm that sounds like bigeminy, grade III/VI high pitched crescendo/decrescendo murmur heard best at RSB, no diastolic murmur Abdomen:soft, non-tender, no masses or organomegaly  Extremities:warm, well-perfused, pulses not palpable in feet, no LE edema Rectal/GUDeferred Neuro:Grossly non-focal and symmetrical throughout Skin:Clean and dry, no rashes, no breakdown   Diagnostic Tests:              *[facility]*         [address]            [place], [place] [address]              [phone]  ------------------------------------------------------------------- Transthoracic Echocardiography  Patient:  Caden, Fukushima MR #:    [id number] Study Date: 05/23/2014 Gender:   M Age:    39 Height:   175.3 cm Weight:   80.3 kg BSA:    1.99 m^2 Pt. Status: Room:  ATTENDING  Esmond Plants, MD, [facility] SONOGRAPHER [facility] ORDERING   [place], Tim REFERRING  [place], Tim PERFORMING  [facility], [place]  cc:  -------------------------------------------------------------------  ------------------------------------------------------------------- History:  PMH:  Syncope and murmur. Coronary artery disease. Aortic valve disease. Risk factors: Hypertension. Diabetes mellitus. Dyslipidemia.  ------------------------------------------------------------------- Study Conclusions  - Left ventricle: The cavity size was normal. There was mild concentric hypertrophy. Systolic function was normal. Wall motion was normal; there were no regional wall motion abnormalities. Doppler parameters are consistent with abnormal left ventricular relaxation (grade  1 diastolic dysfunction). - Aortic valve: Transvalvular velocity was increased. There was severe stenosis. There was mild to moderate regurgitation. Mean gradient (S): 45 mm Hg. Peak gradient (S): 79 mm Hg. Valve area (VTI): 0.82 cm^2. - Mitral valve: Calcified annulus. - Left atrium: The atrium was moderately dilated. - Right ventricle: Systolic function was normal. - Pulmonary arteries: Systolic pressure was within the normal range.  ------------------------------------------------------------------- Labs, prior tests, procedures, and surgery: Echocardiography (April 2014).  The aortic valve showed moderate to severe stenosis. Aortic valve: peak gradient of 52 mm Hg.  Transthoracic echocardiography. M-mode, complete 2D, spectral Doppler, and color Doppler. Birthdate: Patient birthdate: [birth year]. Age: Patient is 80 yr old. Sex: Gender: male. BMI: 26.1 kg/m^2. Blood pressure:   142/72 Patient status: Inpatient. Study date: Study date: 05/23/2014. Study time: 01:08 PM.  -------------------------------------------------------------------  ------------------------------------------------------------------- Left ventricle: The cavity size was normal. There was mild concentric hypertrophy. Systolic function was normal. Wall motion was normal; there were no regional wall motion abnormalities. Doppler parameters are consistent with abnormal left ventricular relaxation (grade 1 diastolic dysfunction).  ------------------------------------------------------------------- Aortic valve: Poorly visualized. Trileaflet; normal thickness, severely calcified leaflets. Mobility was not restricted. Doppler: Transvalvular velocity was increased. There was severe stenosis. There was mild to moderate regurgitation.  VTI ratio of LVOT to aortic valve: 0.21. Valve area (VTI): 0.82 cm^2. Indexed valve area (VTI): 0.41 cm^2/m^2. Valve area (Vmax): 0.81 cm^2. Indexed  valve area (Vmax): 0.41 cm^2/m^2. Mean velocity ratio of LVOT to aortic valve: 0.23. Valve area (Vmean): 0.86 cm^2. Indexed valve area (Vmean): 0.43 cm^2/m^2.  Mean gradient (S): 45 mm Hg. Peak gradient (S): 79 mm Hg.  ------------------------------------------------------------------- Aorta: Aortic root: The aortic root was mildly dilated.  ------------------------------------------------------------------- Mitral valve:  Calcified annulus. Mobility was not restricted. Doppler: Transvalvular velocity was within the normal range. There was no evidence for stenosis. There was no regurgitation.  Valve area by pressure half-time: 1.76 cm^2. Indexed valve area by pressure half-time: 0.88 cm^2/m^2.  ------------------------------------------------------------------- Left atrium: The atrium was moderately dilated.  ------------------------------------------------------------------- Right ventricle: The cavity size was normal. Wall thickness was normal. Systolic function was normal.  ------------------------------------------------------------------- Pulmonic valve:  Structurally normal valve.  Cusp separation was normal. Doppler: Transvalvular velocity was within the normal range. There was no evidence for stenosis. There was no regurgitation.  ------------------------------------------------------------------- Tricuspid valve:  Structurally normal valve.  Doppler: Transvalvular velocity was within the normal range. There was no regurgitation.  ------------------------------------------------------------------- Pulmonary artery:  The main pulmonary artery was normal-sized. Systolic pressure was within the normal range.  ------------------------------------------------------------------- Right atrium: The atrium was normal in size.  ------------------------------------------------------------------- Pericardium: There was no pericardial  effusion.  ------------------------------------------------------------------- Systemic veins: Inferior vena cava: The vessel was normal in size.  ------------------------------------------------------------------- Measurements  Left ventricle              Value     Reference LV ID, ED, PLAX chordal          45.7 mm    43 - 52 LV ID, ES, PLAX chordal          28  mm    23 - 38 LV fx shortening, PLAX chordal      39  %    >=29 LV PW thickness, ED            18.3 mm    --------- IVS/LV PW ratio, ED            0.83      <=1.3 Stroke volume, 2D             78  ml    --------- Stroke volume/bsa, 2D           39  ml/m^2  --------- LV e&', lateral              3.98 cm/s   --------- LV E/e&', lateral             12.14     --------- LV e&', medial               4.05 cm/s   --------- LV E/e&', medial              11.93     --------- LV e&', average              4.02 cm/s   --------- LV E/e&', average             12.03     ---------  Ventricular septum            Value     Reference IVS thickness, ED             15.2 mm    ---------  LVOT                   Value     Reference LVOT ID, S                22  mm    --------- LVOT area                 3.8  cm^2   --------- LVOT ID                  22  mm    --------- LVOT peak velocity, S           94.4 cm/s   ---------  LVOT mean velocity, S           70.7 cm/s   --------- LVOT VTI, S                20.4 cm    --------- LVOT peak gradient, S           4   mm Hg  --------- Stroke volume (SV), LVOT DP        77.5 ml     --------- Stroke index (SV/bsa), LVOT DP      39  ml/m^2  ---------  Aortic valve               Value     Reference Aortic valve mean velocity, S       312  cm/s   --------- Aortic valve VTI, S            95.1 cm    --------- Aortic mean gradient, S          45  mm Hg  --------- Aortic peak gradient, S          79  mm Hg  --------- VTI ratio, LVOT/AV            0.21      --------- Aortic valve area, VTI          0.82 cm^2   --------- Aortic valve area/bsa, VTI        0.41 cm^2/m^2 --------- Aortic valve area, peak velocity     0.81 cm^2   --------- Aortic valve area/bsa, peak        0.41 cm^2/m^2 --------- velocity Velocity ratio, mean, LVOT/AV       0.23      --------- Aortic valve area, mean velocity     0.86 cm^2   --------- Aortic valve area/bsa, mean        0.43 cm^2/m^2 --------- velocity Aortic regurg pressure half-time     616  ms    ---------  Aorta                   Value     Reference Aortic root ID, ED            41  mm    ---------  Left atrium                Value     Reference LA ID, A-P, ES              49  mm    --------- LA ID/bsa, A-P          (H)   2.46 cm/m^2  <=2.2 LA volume, S               60  ml    --------- LA volume/bsa, S             30.2 ml/m^2  --------- LA volume, ES, 1-p A4C          70  ml    --------- LA volume/bsa, ES, 1-p A4C        35.2 ml/m^2  --------- LA volume, ES, 1-p A2C          50  ml    --------- LA volume/bsa, ES, 1-p A2C        25.1 ml/m^2  ---------  Mitral valve               Value     Reference Mitral E-wave peak velocity  48.3 cm/s   --------- Mitral A-wave peak velocity        109  cm/s   --------- Mitral deceleration time     (H)   335  ms    150 - 230 Mitral pressure half-time         125  ms    --------- Mitral E/A ratio, peak          0.4      --------- Mitral valve area, PHT, DP        1.76 cm^2   --------- Mitral valve area/bsa, PHT, DP      0.88 cm^2/m^2 --------- Mitral maximal regurg velocity,      385  cm/s   --------- PISA  Pulmonary arteries            Value     Reference PA pressure, S, DP            30  mm Hg  <=30  Tricuspid valve              Value     Reference Tricuspid regurg peak velocity      261  cm/s   --------- Tricuspid peak RV-RA gradient       27  mm Hg  ---------  Systemic veins              Value     Reference Estimated CVP               3   mm Hg  ---------  Right ventricle              Value     Reference RV pressure, S, DP            30  mm Hg  <=30 RV s&', lateral, S             12.5 cm/s   ---------  Legend: (L) and (H) mark values outside specified reference range.  ------------------------------------------------------------------- Prepared and Electronically Authenticated by  Esmond Plants, MD, [facility] 2016-01-08T17:11:19   Conclusion     Nondominant right coronary system.  Ostial RCA not well visualized, unable to exclude stenosis (small to moderate sized vessel)  Dist LAD lesion, 40% stenosed.  Otherwise mild lumenal irregularies  Unable to cross the aortic valve secondary to severe stenosis.  Challenging case secondary to access in the right groin (significant scar tissue) and difficulty extending through the AAA endograft.     Coronary Findings    Dominance: Left   Left Anterior  Descending   . Dist LAD lesion, 40% stenosed.      Left Heart    Aortic Valve There is severe aortic valve stenosis. The aortic valve is calcified. There is restricted aortic valve motion.   Aorta The size of the ascending aorta is normal.    Coronary Diagrams    Diagnostic Diagram            Hemo Data    AO Systolic Cath Pressure AO Diastolic Cath Pressure AO Mean Cath Pressure   129 59 mmHg 82 mmHg   123 56 mmHg 80 mmHg   119 44 mmHg 69 mmHg       ADDENDUM REPORT: 10/07/2014 15:51  CLINICAL DATA: Aortic stenosis  EXAM: Cardiac TAVR CT  TECHNIQUE: The patient was scanned on a Philips 256 scanner. A 120 kV retrospective scan was triggered in the descending thoracic aorta at 111 HU's. Gantry rotation speed was 270 msecs and collimation was .9  mm. No beta blockade or nitro were given. The 3D data set was reconstructed in 5% intervals of the R-R cycle. Systolic and diastolic phases were analyzed on a dedicated work station using MPR, MIP and VRT modes. The patient received 80 cc of contrast.  FINDINGS: Aortic Valve: Heavily calcified trileaflet aortic valve. No significant calcification of the STJ or annulus. No significant mitral annular calcification or basal septal bulging into the LVOT  Aorta: Mildly dilated aortic root. Mild calcification of the arch and descending thoracic aorta with mild mural aortic debris in the descending thoracic aorta  Sinotubular Junction: 31 mm  Ascending Thoracic Aorta: 4.2 cm  Aortic Arch: 32 mm  Descending Thoracic Aorta: 30 mm  Sinus of Valsalva Measurements:  Non-coronary: 36 mm  Right -coronary: 35.7 mm  Left -coronary: 35.6 mm  Coronary Artery Height above Annulus:  Left Main: 13.8 mm above annulus  Right Coronary: 17.7 mm above annulus  Virtual Basal Annulus Measurements:  Maximum/Minimum Diameter: 27 x 24.5 mm in 30%  phase  Perimeter: 85 mm  Area: 539 mm2  Coronary Arteries: Calcified sufficient height above annulus for deployment see cath report  Optimum Fluoroscopic Angle for Delivery: LAO 19 degrees Cranial 1 degree  IMPRESSION: 1) Calcified trileaflet aortic valve probably best suited for a 29 mm Sapien 3 valve. Area is lower limit but generous sinuses and lack of calcium in STJ, annulus and mitral annulus suggests bigger valve would possible  2) Suitable coronary height above annulus for deployment  3) No aortic root contraindications to transaortic or tranapical delivery  4) No LAA thrombus patient in afib during study  5) Optimum angiographic angle for delivery LAO 19 degrees Cranial 1 degree  Jenkins Rouge   Electronically Signed  By: Jenkins Rouge M.D.  On: 10/07/2014 15:51      Study Result     EXAM: OVER-READ INTERPRETATION CT CHEST  The following report is an over-read performed by radiologist Dr. Laurence Ferrari [facility] on 10/07/2014. This over-read does not include interpretation of cardiac or coronary anatomy or pathology. The coronary CTA interpretation by the cardiologist is attached.  COMPARISON: Multiple exams, including 09/17/2014  FINDINGS: AP window lymph node 1 cm in short axis diameter, image 187 series 501. Right paratracheal node 0.9 cm in short axis, image 214 series 501. 4 mm pulmonary nodule observed in the right middle lobe, image 348 series 501.  Bilateral airway thickening. Right upper paratracheal lymph node 1.1 cm in short axis, image 17 series 502.  IMPRESSION: 1. Airway thickening is present, suggesting bronchitis or reactive airways disease. 2. Mild right upper paratracheal lymph nodal enlargement with borderline enlargement of an AP window lymph node. These may well be reactive but are technically nonspecific. These were present 06/21/2006 and accordingly are probably  benign. 3. 4 mm right middle lobe pulmonary nodule, stable from 06/21/2006 and accordingly considered benign.  Electronically Signed: By: Van Clines M.D. On: 10/07/2014 13:37     CLINICAL DATA: 80 year old male with history of severe aortic stenosis. Preprocedural study prior to potential transcatheter aortic valve replacement (TAVR).  EXAM: CT ANGIOGRAPHY CHEST, ABDOMEN AND PELVIS  TECHNIQUE: Multidetector CT imaging through the chest, abdomen and pelvis was performed using the standard protocol during bolus administration of intravenous contrast. Multiplanar reconstructed images and MIPs were obtained and reviewed to evaluate the vascular anatomy.  CONTRAST: 40mL OMNIPAQUE IOHEXOL 350 MG/ML SOLN  COMPARISON: CTA of the abdomen and pelvis 05/22/2013. CTA of the chest, abdomen and pelvis 06/21/2006.  FINDINGS: CTA  CHEST FINDINGS  Mediastinum/Lymph Nodes: Heart size is borderline enlarged with evidence of concentric left ventricular hypertrophy. The apex of the left ventricular cavity is immediately deep to the anterior aspect of the left fifth-sixth intercostal space. There is no significant pericardial fluid, thickening or pericardial calcification. Severely thickened and heavily calcified aortic valve, compatible with the reported clinical history of aortic stenosis. Mild mitral annular calcifications. There is atherosclerosis of the thoracic aorta, the great vessels of the mediastinum and the coronary arteries, including calcified atherosclerotic plaque in the left main, left anterior descending left circumflex and right coronary arteries. In addition, there are moderate stenosis of the subclavian arteries bilaterally, measuring 5.2 x 4.1 mm proximally on the right, and 5.9 x 4.1 mm proximally on the left. No pathologically enlarged mediastinal or hilar lymph nodes. Esophagus is unremarkable in appearance. No axillary  lymphadenopathy.  Lungs/Pleura: Patchy areas of subpleural reticulation are noted throughout the periphery of the lungs bilaterally. Mild centrilobular and paraseptal emphysema. Mild diffuse bronchial wall thickening. No acute consolidative airspace disease. No pleural effusions. No suspicious appearing pulmonary nodules or masses.  Musculoskeletal/Soft Tissues: There are no aggressive appearing lytic or blastic lesions noted in the visualized portions of the skeleton.  CTA ABDOMEN AND PELVIS FINDINGS  Hepatobiliary: 1.4 cm low-attenuation lesion in segment 5 of the liver is unchanged, and compatible with a small simple cyst. No other suspicious appearing cystic for solid hepatic lesions. No intra or extrahepatic biliary ductal dilatation. Gallbladder is largely collapsed common otherwise unremarkable in appearance.  Pancreas: No pancreatic mass. No pancreatic ductal dilatation. No pancreatic or peripancreatic fluid or inflammatory changes.  Spleen: Unremarkable.  Adrenals/Urinary Tract: Bilateral adrenal glands are normal in appearance. Multiple sub cm low-attenuation lesions in the kidneys bilaterally are too small to definitively characterize, but are favored to represent small cysts. In addition, there are multiple larger well-defined low-attenuation nonenhancing renal lesions bilaterally, largest of which is an exophytic 5.8 cm lesion in the lower pole of the left kidney, compatible with simple cysts. No hydroureteronephrosis. Urinary bladder is normal in appearance.  Stomach/Bowel: Normal appearance of the stomach. No pathologic dilatation of small bowel or colon. Numerous colonic diverticulae are noted, most evident in the region of the sigmoid colon, without surrounding inflammatory changes to suggest an acute diverticulitis at this time.  Vascular/Lymphatic: Vascular findings and measurements pertinent to potential TAVR procedure, as detailed below.  Aneurysmal dilatation of the suprarenal abdominal aorta which measures up to 3.7 x 3.2 cm at the level of the origin of the superior mesenteric artery (image 150 of series 401), slightly increased compared to the prior study. In addition, there is a fusiform infrarenal abdominal aortic aneurysm, status post aortobi-iliac stent graft placement. The largest portion of the aneurysm sac measures up to 6.3 x 5.5 cm (image 214 of series 401), which is slightly increased compared to prior study 05/22/2013 (at which point this measured 6.0 x 5.1 cm). Image 189 of series 401 demonstrates a probable blush of contrast material within the aneurysm sac posteriorly on the right side, suggestive of type 2 endoleak. Additionally, in the inferior aspect of the aortic portion of the graft, there appears to be separation of the struts from the graft fabric, best appreciated on image 219 of series 401, where contrast is seen beyond the outer confines of the struts, but well in capsulated within the graft fabric. This aneurysm extends into the common iliac arteries bilaterally, with the right common iliac artery dilated up to 2.7 cm in diameter, and  the left common iliac artery dilated up to 2.3 cm in diameter, both similar to the prior examination. 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 There is a large number of embolization coils in the right common iliac artery (unchanged), which causes extensive beam hardening artifact in the right hemipelvis, partially obscuring the distal right common iliac artery vasculature. Single renal arteries bilaterally, with moderate to severe stenosis on the right and moderate stenosis on the left. There is also high-grade stenosis of the proximal right superficial femoral artery. No lymphadenopathy noted in the abdomen or pelvis.  Reproductive: Prostate gland and seminal vesicles are unremarkable in appearance.  Other:  Postoperative changes of bilateral inguinal herniorrhaphy with multiple soft tissue anchors from mesh repair bilaterally. No significant volume of ascites. No pneumoperitoneum.  Musculoskeletal: There are no aggressive appearing lytic or blastic lesions noted in the visualized portions of the skeleton.  VASCULAR MEASUREMENTS PERTINENT TO TAVR:  AORTA:  Minimal Aortic Diameter - 19 x 13 mm  Severity of Aortic Calcification - moderate  RIGHT PELVIS:  Right Common Iliac Artery -  Minimal Diameter - 10.1 x 9.6 mm in the visualized portion of the vessel (distal right common iliac artery is completely obscured by beam hardening artifact from the a large amount of embolization coils in the proximal right internal iliac artery).  Tortuosity - mild  Calcification - stent graft present  Right External Iliac Artery -  Minimal Diameter - 6.4 x 5.7 mm  Tortuosity - moderate to severe  Calcification - moderate (stent graft present proximally)  Right Common Femoral Artery -  Minimal Diameter - 5.9 x 4.9 mm  Tortuosity - mild  Calcification - moderate  LEFT PELVIS:  Left Common Iliac Artery -  Minimal Diameter - 10.9 x 8.7 mm  Tortuosity - mild  Calcification - stent graft present  Left External Iliac Artery -  Minimal Diameter - 5.6 x 5.4 mm  Tortuosity - moderate  Calcification - moderate (stent graft present proximally)  Left Common Femoral Artery -  Minimal Diameter - 5.4 x 4.8 mm  Tortuosity - mild  Calcification - moderate  Review of the MIP images confirms the above findings.  IMPRESSION: 1. Vascular findings and measurements pertinent to potential TAVR procedure, as detailed above. This patient does not appear to have suitable pelvic arterial access. As well, there is moderate to severe stenosis of the proximal subclavian arteries bilaterally. 2. There are both suprarenal and infrarenal abdominal  aortic aneurysms, in addition to aneurysmal dilatation of the common iliac arteries bilaterally. Patient is status post aortobi-iliac stent graft placement, and there continues to be slight progressive enlargement of the excluded aneurysm sac, with evidence of potential type 2 endoleak (likely from a lumbar artery) on today's examination, as discussed above. 3. Colonic diverticulosis without findings to suggest acute diverticulitis at this time. 4. Additional incidental findings, as above.   Electronically Signed  By: Vinnie Langton M.D.  On: 10/14/2014 15:04    Ref Range 9d ago    FVC-Pre L 3.03   FVC-%Pred-Pre % 79   FVC-Post L 3.16   FVC-%Pred-Post % 83   FVC-%Change-Post % 4   FEV1-Pre L 2.11   FEV1-%Pred-Pre % 77   FEV1-Post L 2.30   FEV1-%Pred-Post % 84   FEV1-%Change-Post % 8   FEV6-Pre L 2.99   FEV6-%Pred-Pre % 84   FEV6-Post L 3.12   FEV6-%Pred-Post % 88   FEV6-%Change-Post % 4   Pre FEV1/FVC ratio % 70   FEV1FVC-%Pred-Pre %  96   Post FEV1/FVC ratio % 73   FEV1FVC-%Change-Post % 4   Pre FEV6/FVC Ratio % 99   FEV6FVC-%Pred-Pre % 106   Post FEV6/FVC ratio % 99   FEV6FVC-%Pred-Post % 106   FEV6FVC-%Change-Post % 0   FEF 25-75 Pre L/sec 1.30   FEF2575-%Pred-Pre % 68   FEF 25-75 Post L/sec 1.86   FEF2575-%Pred-Post % 97   FEF2575-%Change-Post % 43   RV L 2.48   RV % pred % 99   TLC L 5.64   TLC % pred % 84   DLCO unc ml/min/mmHg 15.00   DLCO unc % pred % 50   DL/VA ml/min/mmHg/L 3.58   DL/VA % pred % 80   Resulting Agency [facility]       Specimen Collected: 10/07/14 2:09 PM Last Resulted: 10/07/14 3:12 PM             Procedure: AV Replacement  Risk of Mortality: 2.168%  Morbidity or Mortality: 16.584%  Long Length of Stay: 5.461%  Short  Length of Stay: 33.983%  Permanent Stroke: 2.272%  Prolonged Ventilation: 7.364%  DSW Infection: 0.245%  Renal Failure: 5.194%  Reoperation: 8.92%   Impression:  This 80 year old gentleman has severe, symptomatic Stage D aortic stenosis with NYHA stage II functional class. He has had two episodes of profound weakness and near syncope. I have personally reviewed his 2D echo and cardiac cath films. He has a trileaflet aortic valve with severely calcified leaflets with a mean gradient of 45 mm Hg. He has no significant coronary disease. I think aortic valve replacement is indicated in this patient. His STS PROM score for open surgical AVR is only 2.2% but I think it is probably significantly higher considering his diffuse, calcific vascular disease with an abdominal aortic stent graft, left gortex femoral popliteal bypass, moderate left carotid stenosis, stenosis of the innominate/right subclavian and left subclavian arteries, diabetes, rheumatoid arthritis on chronic methotrexate, and reduced mobility.   I have personally reviewed his CT scans. His cardiac CT shows that he would be an acceptable candidate for a Sapien 3 or XT valve, most likely a 29 mm. His abdominal and pelvic CTA show that he has significant aortoiliac disease with an aortobi-iliac stent graft and there continues to be slight progressive enlargement of the excluded aneurysm sac with evidence of potential type 2 endoleak. The aneurysm sac is now 6.3 x 5.5 cm compared to 6.0 x 5.1 cm in Jan 2015 when he had his coiling procedure for endoleak by Dr. Lucky Cowboy. I think delivering a TAVR valve via the femoral arteries would be difficult and risky due to his stent graft, the extent of disease and the left femoral to popliteal gortex graft. His ascending aorta appears adequate for a transaortic approach. I discussed the scan results with the patient, his wife and daughter and answered their questions.   I discussed transcatheter aortic valve  replacement, what types of management strategies would be attempted intraoperatively in the event of life-threatening complications, including whether or not the patient would be considered a candidate for the use of cardiopulmonary bypass and/or conversion to open sternotomy for attempted surgical intervention. The patient has been advised of a variety of complications that might develop including but not limited to risks of death, stroke, paravalvular leak, aortic dissection or other major vascular complications, aortic annulus rupture, device embolization, cardiac rupture or perforation, mitral regurgitation, acute myocardial infarction, arrhythmia, heart block or bradycardia requiring permanent pacemaker placement, congestive heart failure, respiratory failure, renal  failure, pneumonia, infection, other late complications related to structural valve deterioration or migration, or other complications that might ultimately cause a temporary or permanent loss of functional independence or other long term morbidity. The patient provides full informed consent for the procedure as described and all questions were answered. He would like to continue with further evaluation.   Plan:      Direct transaortic TAVR via a right anterior thoracotomy

## 2014-11-18 NOTE — Anesthesia Procedure Notes (Signed)
Procedure Name: Intubation Date/Time: 11/18/2014 8:03 AM Performed by: Rebekah Chesterfield L Pre-anesthesia Checklist: Patient identified, Emergency Drugs available, Suction available, Patient being monitored and Timeout performed Patient Re-evaluated:Patient Re-evaluated prior to inductionOxygen Delivery Method: Circle system utilized Preoxygenation: Pre-oxygenation with 100% oxygen Intubation Type: IV induction Ventilation: Mask ventilation with difficulty and Oral airway inserted - appropriate to patient size Laryngoscope Size: Mac and 3 Grade View: Grade I Tube type: Oral Tube size: 7.5 mm Number of attempts: 1 Airway Equipment and Method: Stylet Placement Confirmation: ETT inserted through vocal cords under direct vision Secured at: 21 cm Tube secured with: Tape Dental Injury: Teeth and Oropharynx as per pre-operative assessment  Comments: Easy mask c opa

## 2014-11-18 NOTE — Op Note (Signed)
CARDIOTHORACIC SURGERY OPERATIVE NOTE  Date of Procedure:07/29/2014  Preoperative Diagnosis:Severe Aortic Stenosis   Postoperative Diagnosis:Same   Procedure:  Transcatheter Aortic Valve Replacement - Transaortic Approach via Right Anterior 2nd ICS Thoracotomy using an Edwards Sapien THV (size 29 mm, model # 9300TFX, serial # [id number])  Co-Surgeons:Catlynn Grondahl Alveria Apley, MD and Sherren Mocha, MD    Anesthesiologist:David Linna Caprice, MD  Echocardiographer:Peter Johnsie Cancel, MD  Pre-operative Echo Findings:  Severe aortic stenosis  Moderate AI  normal left ventricular systolic function  Post-operative Echo Findings:  Mild paravalvular leak  Normal left ventricular systolic function    BRIEF CLINICAL NOTE AND INDICATIONS FOR SURGERY  The patient is a 80 year old vasculopath with a history of AODM, hyperlipidemia, remote smoking, rheumatoid arthritis on methotrexate for years and severe symptomatic Stage D aortic stenosis with NYHA stage II functional class. He underwent AAA repair with a stent graft approximately 6 years ago and then repair of a type 2 endoleak in 05/1013. He underwent right CEA in 09/2012. His most recent carotid ultrasound on 04/25/2014 showed a patent right internal carotid. There was a 50-69% left internal carotid artery stenosis and there were elevated velocities consistent with stenosis of the right subclavian or innominate artery and the left subclavian artery. This has been followed by Dr. Lucky Cowboy in [place]. He reports an episode of near syncope last summer while repairing his lawn mower and then again in January 2016 after helping his wife cook a New Years meal. With both of these episodes he felt terrible and weak He reports having progressive exertional shortness of breath and fatigue but usually only happens with heavy exertion. He can do his  normal daily activities around the house without difficulty but his wife says that he sleeps a lot. He denies any chest pain, orthopnea, or PND. He recently underwent cardiac cath by Dr. Rockey Situ showing mild non-obstructive coronary disease. The aortic valve was not crossed. I think the risk for open surgical AVR is very high considering his diffuse, calcific vascular disease with an abdominal aortic stent graft, left gortex femoral popliteal bypass, moderate left carotid stenosis, stenosis of the innominate/right subclavian and left subclavian arteries, diabetes, rheumatoid arthritis on chronic methotrexate, and reduced mobility.  I have personally reviewed his CT scans. His cardiac CT shows that he would be an acceptable candidate for a Sapien 3 or XT valve, most likely a 29 mm. His abdominal and pelvic CTA show that he has significant aortoiliac disease with an aortobi-iliac stent graft and there continues to be slight progressive enlargement of the excluded aneurysm sac with evidence of potential type 2 endoleak. The aneurysm sac is now 6.3 x 5.5 cm compared to 6.0 x 5.1 cm in Jan 2015 when he had his coiling procedure for endoleak by Dr. Lucky Cowboy. I think delivering a TAVR valve via the femoral arteries would be difficult and risky due to his stent graft, the extent of disease and the left femoral to popliteal gortex graft. His ascending aorta appears adequate for a transaortic approach.  Following the decision to proceed with transcatheter aortic valve replacement, a discussion has been held regarding what types of management strategies would be attempted intraoperatively in the event of life-threatening complications, including whether or not the patient would be considered a candidate for the use of cardiopulmonary bypass and/or conversion to open sternotomy for attempted surgical intervention. The patient has been advised of a variety of complications that might develop peculiar to this approach including but  not limited to risks of death,  stroke, paravalvular leak, aortic dissection or other major vascular complications, aortic annulus rupture, device embolization, cardiac rupture or perforation, acute myocardial infarction, arrhythmia, heart block or bradycardia requiring permanent pacemaker placement, congestive heart failure, respiratory failure, renal failure, pneumonia, infection, other late complications related to structural valve deterioration or migration, or other complications that might ultimately cause a temporary or permanent loss of functional independence or other long term morbidity. The patient provides full informed consent for the procedure as described and all questions were answered preoperatively.    DETAILS OF THE OPERATIVE PROCEDURE  PREPARATION:   The patient is brought to the operating room on the above mentioned date and central monitoring was established by the anesthesia team including placement of Swan-Ganz catheter and radial arterial line. The patient is placed in the supine position on the operating table. Intravenous antibiotics are administered. General endotracheal anesthesia is induced uneventfully. A Foley catheter is placed.  Baseline transesophageal echocardiogram was performed. Findings were notable for severe aortic stenosis and normal LV function.  The patient's chest, abdomen, both groins, and both lower extremities are prepared and draped in a sterile manner. A time out procedure is performed.   TRANSAORTIC ACCESS:   A right anterior thoracotomy is performed through the 2nd ICS. The right IMA was ligated and divided in the 2nd ICS. The second costal cartilage was divided at the sternum. The pericardium was opened laterally. The lung was adherent to the mediastinal pleura and this was dissected from the pericardium. The antero-lateral surface of the ascending aorta is exposed. An appropriate site for cannulation is chosen and a double purse string  suture is placed using pledgeted 2-0 Ethibond suture.  The patient is heparinized systemically and ACT verified > 250 seconds.   See Dr. Antionette Char note for the remainder of the transcatheter valve insertion.  PROCEDURE COMPLETION:  The aortic sheath is removed during rapid ventricular pacing to maintain systolic blood pressure < 70 mmHg while the pursestring sutures are tied. The aortic closure is inspected and there was some bleeding from the site. This was reinforced with a 3-0 prolene pledgetted suture.  Protamine is administered. Once hemostasis has been ascertained, the pericardium is drained using a single 28 Fr Bard chest tube. The 2nd rib is returned to its normal position and fixed using a titanium rib plate with three 12 mm screws in the sternum and two 12 mm and 1 10 mm screw in the rib. The incision is closed in layers and the skin incision closed using a subcuticular skin closure. The temporary pacemaker, pigtail catheters and all femoral sheaths are removed.  The patient tolerated the procedure well and is transported to the surgical intensive care in stable condition. There are no intraoperative complications. All sponge instrument and needle counts are verified correct at completion of the operation.  No blood products were administered during the operation.     Gaye Pollack, MD 11/18/2014

## 2014-11-18 NOTE — Transfer of Care (Signed)
Immediate Anesthesia Transfer of Care Note  Patient: Chase Leland Her Sr.  Procedure(s) Performed: Procedure(s): TRANSCATHETER AORTIC VALVE REPLACEMENT, TRANSAORTIC (N/A) TRANSESOPHAGEAL ECHOCARDIOGRAM (TEE) (N/A)  Patient Location: SICU  Anesthesia Type:General  Level of Consciousness: awake, alert , oriented and patient cooperative  Airway & Oxygen Therapy: Patient Spontanous Breathing and Patient connected to face mask oxygen  Post-op Assessment: Report given to RN, Post -op Vital signs reviewed and stable and Patient moving all extremities X 4  Post vital signs: Reviewed and stable  Last Vitals:  Filed Vitals:   11/18/14 0547  BP:   Pulse: 46  Temp:   Resp:     Complications: No apparent anesthesia complications

## 2014-11-19 ENCOUNTER — Inpatient Hospital Stay (HOSPITAL_COMMUNITY): Payer: Medicare HMO

## 2014-11-19 ENCOUNTER — Encounter (HOSPITAL_COMMUNITY): Payer: Self-pay | Admitting: Surgery

## 2014-11-19 DIAGNOSIS — I359 Nonrheumatic aortic valve disorder, unspecified: Secondary | ICD-10-CM

## 2014-11-19 DIAGNOSIS — I350 Nonrheumatic aortic (valve) stenosis: Principal | ICD-10-CM

## 2014-11-19 LAB — CBC
HEMATOCRIT: 31.1 % — AB (ref 39.0–52.0)
Hemoglobin: 9.8 g/dL — ABNORMAL LOW (ref 13.0–17.0)
MCH: 27.4 pg (ref 26.0–34.0)
MCHC: 31.5 g/dL (ref 30.0–36.0)
MCV: 86.9 fL (ref 78.0–100.0)
PLATELETS: 135 10*3/uL — AB (ref 150–400)
RBC: 3.58 MIL/uL — ABNORMAL LOW (ref 4.22–5.81)
RDW: 16.8 % — AB (ref 11.5–15.5)
WBC: 13 10*3/uL — ABNORMAL HIGH (ref 4.0–10.5)

## 2014-11-19 LAB — GLUCOSE, CAPILLARY
GLUCOSE-CAPILLARY: 105 mg/dL — AB (ref 65–99)
Glucose-Capillary: 102 mg/dL — ABNORMAL HIGH (ref 65–99)
Glucose-Capillary: 134 mg/dL — ABNORMAL HIGH (ref 65–99)

## 2014-11-19 LAB — BASIC METABOLIC PANEL
Anion gap: 8 (ref 5–15)
BUN: 12 mg/dL (ref 6–20)
CHLORIDE: 106 mmol/L (ref 101–111)
CO2: 23 mmol/L (ref 22–32)
CREATININE: 0.97 mg/dL (ref 0.61–1.24)
Calcium: 8 mg/dL — ABNORMAL LOW (ref 8.9–10.3)
GLUCOSE: 112 mg/dL — AB (ref 65–99)
POTASSIUM: 4.3 mmol/L (ref 3.5–5.1)
Sodium: 137 mmol/L (ref 135–145)

## 2014-11-19 LAB — MAGNESIUM: MAGNESIUM: 1.7 mg/dL (ref 1.7–2.4)

## 2014-11-19 MED ORDER — DOCUSATE SODIUM 100 MG PO CAPS
200.0000 mg | ORAL_CAPSULE | Freq: Every day | ORAL | Status: DC
  Administered 2014-11-19 – 2014-11-22 (×4): 200 mg via ORAL
  Filled 2014-11-19 (×4): qty 2

## 2014-11-19 MED ORDER — BISACODYL 5 MG PO TBEC: 10.0000 mg | DELAYED_RELEASE_TABLET | Freq: Every day | ORAL | Status: DC | PRN

## 2014-11-19 MED ORDER — BISACODYL 10 MG RE SUPP: 10.0000 mg | Freq: Every day | RECTAL | Status: DC | PRN

## 2014-11-19 MED ORDER — LISINOPRIL 10 MG PO TABS
10.0000 mg | ORAL_TABLET | Freq: Every day | ORAL | Status: DC
  Administered 2014-11-19: 10 mg via ORAL
  Filled 2014-11-19 (×3): qty 1

## 2014-11-19 MED ORDER — ONDANSETRON HCL 4 MG PO TABS: 4.0000 mg | ORAL_TABLET | Freq: Four times a day (QID) | ORAL | Status: DC | PRN

## 2014-11-19 MED ORDER — SODIUM CHLORIDE 0.9 % IJ SOLN: 3.0000 mL | INTRAMUSCULAR | Status: DC | PRN

## 2014-11-19 MED ORDER — SODIUM CHLORIDE 0.9 % IJ SOLN
3.0000 mL | Freq: Two times a day (BID) | INTRAMUSCULAR | Status: DC
  Administered 2014-11-19 – 2014-11-22 (×7): 3 mL via INTRAVENOUS

## 2014-11-19 MED ORDER — MOVING RIGHT ALONG BOOK
Freq: Once | Status: AC
  Administered 2014-11-19: 15:00:00
  Filled 2014-11-19: qty 1

## 2014-11-19 MED ORDER — ONDANSETRON HCL 4 MG/2ML IJ SOLN: 4.0000 mg | Freq: Four times a day (QID) | INTRAMUSCULAR | Status: DC | PRN

## 2014-11-19 MED ORDER — SODIUM CHLORIDE 0.9 % IV SOLN: 250.0000 mL | INTRAVENOUS | Status: DC | PRN

## 2014-11-19 MED FILL — Insulin Regular (Human) Inj 100 Unit/ML: INTRAMUSCULAR | Qty: 2.5 | Status: AC

## 2014-11-19 MED FILL — Potassium Chloride Inj 2 mEq/ML: INTRAVENOUS | Qty: 40 | Status: AC

## 2014-11-19 MED FILL — Magnesium Sulfate Inj 50%: INTRAMUSCULAR | Qty: 10 | Status: AC

## 2014-11-19 MED FILL — Sodium Chloride IV Soln 0.9%: INTRAVENOUS | Qty: 1000 | Status: AC

## 2014-11-19 NOTE — Addendum Note (Signed)
Addendum  created 11/19/14 0650 by Roberts Gaudy, MD   Modules edited: Notes Section   Notes Section:  File: [id number]; Pend: [id number]; Pend: [id number]; Pend: [id number]

## 2014-11-19 NOTE — Progress Notes (Signed)
PT received from 2south to 2w08 accompanied by Wife and RN, alert and oriented, vitals stable. Currently denies pain, pt oriented to the unit, call bell within pt's reach. Report in regards to pt current medical status received from [place], [place], will continue to monitor pt.

## 2014-11-19 NOTE — Progress Notes (Signed)
CARDIAC REHAB PHASE I   PRE:  Rate/Rhythm: 68 SR  BP:  Supine: 99/67  Sitting:   Standing:    SaO2: 95%RA  MODE:  Ambulation: 400 ft   POST:  Rate/Rhythm: 78  BP:  Supine:   Sitting: 102/58  Standing:    SaO2: 95%RA 1445-1510 Pt walked 400 ft on RA with rolling walker and asst x 1 with steady gait. Tolerated well. Has walker at home if needed. Encouraged IS. Pt can reach 1000 ml. To bed after walk.   Graylon Good, RN BSN  11/19/2014 3:08 PM

## 2014-11-19 NOTE — Progress Notes (Signed)
Echocardiogram 2D Echocardiogram has been performed.  Chase Bell 11/19/2014, 2:46 PM

## 2014-11-19 NOTE — Progress Notes (Signed)
Anesthesiology Follow-up:  Awake and alert, neuro intact, in good spirits. Mildly hypertensive on nitroglycerin for BP control.  VS: T- 37.1 BP-143/48 HR- 69 (SR with varying P wave morphology and a few PVCs) RR 17 O2 Sat 96% 1 L  K-4.3 glucose 123 BUN/Cr. -12/0.97 H/H- 9.8/31 platelts 131,000  POD #1 following TAVR with #29 Sapien XT via transaortic approach, Mild paravalvular leak post-procedure.  Overall ding well, mild hypertensive with atrial ectopy, extubated in the OR, overall doing well.  Roberts Gaudy

## 2014-11-19 NOTE — Progress Notes (Signed)
1 Day Post-Op Procedure(s) (LRB): TRANSCATHETER AORTIC VALVE REPLACEMENT, TRANSAORTIC (N/A) TRANSESOPHAGEAL ECHOCARDIOGRAM (TEE) (N/A) Subjective:  No complaints. Pain under adequate control.  Objective: Vital signs in last 24 hours: Temp:  [97.6 F (36.4 C)-99 F (37.2 C)] 98.5 F (36.9 C) (07/06 0715) Pulse Rate:  [48-91] 70 (07/06 0800) Cardiac Rhythm:  [-] Normal sinus rhythm (07/06 0400) Resp:  [10-22] 15 (07/06 0800) BP: (90-163)/(44-60) 102/55 mmHg (07/06 0730) SpO2:  [90 %-100 %] 92 % (07/06 0800) Arterial Line BP: (92-184)/(41-67) 148/49 mmHg (07/06 0800) Weight:  [77.7 kg (171 lb 4.8 oz)] 77.7 kg (171 lb 4.8 oz) (07/06 0500)  Hemodynamic parameters for last 24 hours:    Intake/Output from previous day: 07/05 0701 - 07/06 0700 In: 3969.5 [P.O.:720; I.V.:1894.5; Blood:455; IV YOVZCHYIF:027] Out: 1970 [Urine:1310; Blood:400; Chest Tube:260] Intake/Output this shift:    General appearance: alert and cooperative Neurologic: intact Heart: regular rate and rhythm, systolic murmur at apex, no diastolic murmur Lungs: clear to auscultation bilaterally Extremities: extremities normal, atraumatic, no cyanosis or edema Wound: incision ok  Lab Results:  Recent Labs  11/18/14 1805 11/18/14 1807 11/19/14 0410  WBC 11.3*  --  13.0*  HGB 9.6* 10.2* 9.8*  HCT 30.1* 30.0* 31.1*  PLT 114*  --  135*   BMET:  Recent Labs  11/18/14 1807 11/19/14 0410  NA 139 137  K 4.6 4.3  CL 106 106  CO2  --  23  GLUCOSE 110* 112*  BUN 14 12  CREATININE 0.90 0.97  CALCIUM  --  8.0*    PT/INR:  Recent Labs  11/18/14 1213  LABPROT 16.6*  INR 1.33   ABG    Component Value Date/Time   PHART 7.336* 11/18/2014 1204   HCO3 22.6 11/18/2014 1204   TCO2 19 11/18/2014 1807   ACIDBASEDEF 3.0* 11/18/2014 1204   O2SAT 99.0 11/18/2014 1204   CBG (last 3)   Recent Labs  11/18/14 2342 11/19/14 0352 11/19/14 0719  GLUCAP 117* 102* 134*   CXR: bibasilar atelectasis  ECG:  sinus  Assessment/Plan: S/P Procedure(s) (LRB): TRANSCATHETER AORTIC VALVE REPLACEMENT, TRANSAORTIC (N/A) TRANSESOPHAGEAL ECHOCARDIOGRAM (TEE) (N/A)  He is hemodynamically stable in sinus rhythm. Lisinopril started at half dose for hypertension.  Remove chest tube, sleeve and arterial line  Transfer to 2W and mobilize.  2D echo today.   LOS: 1 day    Gaye Pollack 11/19/2014

## 2014-11-19 NOTE — Progress Notes (Addendum)
    Subjective:  Feels well this am. Expected pain at thoracotomy site but 'not too bad.' Had nausea early this am but this has passed.  Objective:  Vital Signs in the last 24 hours: Temp:  [97.6 F (36.4 C)-99 F (37.2 C)] 98.5 F (36.9 C) (07/06 0715) Pulse Rate:  [48-91] 69 (07/06 0630) Resp:  [10-22] 17 (07/06 0630) BP: (90-163)/(44-60) 122/50 mmHg (07/06 0630) SpO2:  [93 %-100 %] 96 % (07/06 0630) Arterial Line BP: (92-184)/(41-67) 148/51 mmHg (07/06 0630) Weight:  [171 lb 4.8 oz (77.7 kg)] 171 lb 4.8 oz (77.7 kg) (07/06 0500)  Intake/Output from previous day: 07/05 0701 - 07/06 0700 In: 3969.5 [P.O.:720; I.V.:1894.5; Blood:455; IV [id number]:355] Out: 1970 [Urine:1310; Blood:400; Chest Tube:260]  Physical Exam: Pt is alert and oriented, NAD HEENT: normal Neck: JVP - normal Lungs: CTA bilaterally CV: RRR with 3/6 holosystolic murmur at the apex Abd: soft, NT, Positive BS, no hepatomegaly Ext: no C/C/E Skin: warm/dry no rash  Lab Results:  Recent Labs  11/18/14 1805 11/18/14 1807 11/19/14 0410  WBC 11.3*  --  13.0*  HGB 9.6* 10.2* 9.8*  PLT 114*  --  135*    Recent Labs  11/18/14 1807 11/19/14 0410  NA 139 137  K 4.6 4.3  CL 106 106  CO2  --  23  GLUCOSE 110* 112*  BUN 14 12  CREATININE 0.90 0.97   No results for input(s): TROPONINI in the last 72 hours.  Invalid input(s): CK, MB  Tele: Sinus rhythm, personally reviewed  Assessment/Plan:  1. Severe AS s/p TAVR: POD #1. Progressing well. Continue ASA 81 mg and start plavix 75 mg today. Check 2D Echo today.  2. HTN: wean off NTG, resume home lisinopril at half his home dose with hold parameters for SBP < 110. No beta-blocker for now with relative bradycardia may start tomorrow pending BP and heart rate.   3. PAD - stable  D/C a-line, foley. Chest tube management/progression per Dr Cyndia Bent.  Sherren Mocha, M.D. 11/19/2014, 7:25 AM

## 2014-11-20 ENCOUNTER — Other Ambulatory Visit: Payer: Self-pay | Admitting: *Deleted

## 2014-11-20 DIAGNOSIS — I350 Nonrheumatic aortic (valve) stenosis: Secondary | ICD-10-CM

## 2014-11-20 LAB — GLUCOSE, CAPILLARY
GLUCOSE-CAPILLARY: 110 mg/dL — AB (ref 65–99)
GLUCOSE-CAPILLARY: 130 mg/dL — AB (ref 65–99)
GLUCOSE-CAPILLARY: 113 mg/dL — AB (ref 65–99)
Glucose-Capillary: 147 mg/dL — ABNORMAL HIGH (ref 65–99)

## 2014-11-20 MED ORDER — ENSURE ENLIVE PO LIQD
237.0000 mL | Freq: Three times a day (TID) | ORAL | Status: DC
  Administered 2014-11-20 – 2014-11-22 (×7): 237 mL via ORAL

## 2014-11-20 MED ORDER — LISINOPRIL 5 MG PO TABS
5.0000 mg | ORAL_TABLET | Freq: Every day | ORAL | Status: DC
  Filled 2014-11-20: qty 1

## 2014-11-20 MED ORDER — ALBUMIN HUMAN 5 % IV SOLN
12.5000 g | Freq: Once | INTRAVENOUS | Status: AC
  Administered 2014-11-20: 12.5 g via INTRAVENOUS
  Filled 2014-11-20: qty 250

## 2014-11-20 MED ORDER — AMIODARONE HCL IN DEXTROSE 360-4.14 MG/200ML-% IV SOLN
60.0000 mg/h | INTRAVENOUS | Status: AC
  Administered 2014-11-20: 60 mg/h via INTRAVENOUS
  Filled 2014-11-20: qty 200

## 2014-11-20 MED ORDER — METOPROLOL TARTRATE 12.5 MG HALF TABLET
12.5000 mg | ORAL_TABLET | Freq: Two times a day (BID) | ORAL | Status: DC
  Filled 2014-11-20 (×2): qty 1

## 2014-11-20 MED ORDER — SODIUM CHLORIDE 0.9 % IV SOLN
INTRAVENOUS | Status: AC
  Administered 2014-11-20: 17:00:00 via INTRAVENOUS

## 2014-11-20 MED ORDER — AMIODARONE HCL IN DEXTROSE 360-4.14 MG/200ML-% IV SOLN
30.0000 mg/h | INTRAVENOUS | Status: DC
  Administered 2014-11-20 – 2014-11-21 (×2): 30 mg/h via INTRAVENOUS
  Filled 2014-11-20 (×4): qty 200

## 2014-11-20 NOTE — Progress Notes (Signed)
Albbumin drip rate increased from 60 to 182ml/hr at this time due to Pts persistent  low BP.  Pt comfortable in bed, responsive, with family present.  Will con't to monitor closely.

## 2014-11-20 NOTE — Care Management Note (Signed)
Case Management Note  Patient Details  Name: [name]. MRN: [id number] Date of Birth: [birth year]  Subjective/Objective:     Pt admitted with severe aortic stenosis               Action/Plan:  Pt is independent from home with wife and daughter.  CM will continue to monitor for disposition needs   Expected Discharge Date:                  Expected Discharge Plan:  [facility]  In-House Referral:     Discharge planning Services     Post Acute Care Choice:    Choice offered to:     DME Arranged:    DME Agency:     HH Arranged:    [place] Agency:     Status of Service:  In process, will continue to follow  Medicare Important Message Given:    Date Medicare IM Given:    Medicare IM give by:    Date Additional Medicare IM Given:    Additional Medicare Important Message give by:     If discussed at [place] of Stay Meetings, dates discussed:    Additional Comments:  Maryclare Labrador, RN 11/20/2014, 3:02 PM

## 2014-11-20 NOTE — Progress Notes (Signed)
Came to ambulate however pt in Afib with low BP. Will f/u later. Yves Dill CES, ACSM 9:19 AM 11/20/2014

## 2014-11-20 NOTE — Progress Notes (Addendum)
      [address]       [place] [address]             [phone]        2 Days Post-Op Procedure(s) (LRB): TRANSCATHETER AORTIC VALVE REPLACEMENT, TRANSAORTIC (N/A) TRANSESOPHAGEAL ECHOCARDIOGRAM (TEE) (N/A)  Subjective: Patient trying to eat breakfast. He does not have much appetite. Denies abdominal pain, nausea, or emesis.  Objective: Vital signs in last 24 hours: Temp:  [97.5 F (36.4 C)-98.7 F (37.1 C)] 98.3 F (36.8 C) (07/07 0541) Pulse Rate:  [66-88] 88 (07/07 0541) Cardiac Rhythm:  [-] Normal sinus rhythm (07/06 2137) Resp:  [10-26] 18 (07/07 0541) BP: (80-137)/(47-74) 110/63 mmHg (07/07 0541) SpO2:  [92 %-96 %] 94 % (07/07 0541) Arterial Line BP: (148)/(49) 148/49 mmHg (07/06 0800) Weight:  [173 lb 15.1 oz (78.9 kg)] 173 lb 15.1 oz (78.9 kg) (07/07 0541)   Current Weight  11/20/14 173 lb 15.1 oz (78.9 kg)      Intake/Output from previous day: 07/06 0701 - 07/07 0700 In: 670.1 [P.O.:600; I.V.:70.1] Out: 295 [Urine:195; Chest Tube:100]   Physical Exam:  Cardiovascular: RRR, systolic murmur Pulmonary: Diminished at bases L>R; no rales, wheezes, or rhonchi. Abdomen: Soft, non tender, bowel sounds present. Extremities: Trace lower extremity edema. Wounds: Clean and dry.  No erythema or signs of infection.  Lab Results: CBC: Recent Labs  11/18/14 1805 11/18/14 1807 11/19/14 0410  WBC 11.3*  --  13.0*  HGB 9.6* 10.2* 9.8*  HCT 30.1* 30.0* 31.1*  PLT 114*  --  135*   BMET:  Recent Labs  11/18/14 1807 11/19/14 0410  NA 139 137  K 4.6 4.3  CL 106 106  CO2  --  23  GLUCOSE 110* 112*  BUN 14 12  CREATININE 0.90 0.97  CALCIUM  --  8.0*    PT/INR:  Lab Results  Component Value Date   INR 1.33 11/18/2014   INR 1.13 11/13/2014   INR 0.99 09/17/2014   ABG:  INR: Will add last result for INR, ABG once components are confirmed Will add last 4 CBG results once components are confirmed  Assessment/Plan:  1. CV -  Occasional HR into the low 100's. SR in the 90's this am. On Lisinopril 10 mg daily and Plavix 75 mg daily. BP appeared to be improving then nurse notified me BP 74/57. Will stop Lisinopril.Echo done yesterday showed LVEF 55-60%, mild to moderate AI, moderate TR. 2.  Pulmonary - On room air. 3.  Acute blood loss anemia - H and H yesterday 9.8 and 31.1 4. Mild thrombocytopenia-platelets yesterday up to 135,000 5. Ensure tid 6. Hopefully, home in 1-2 days  ZIMMERMAN,DONIELLE MPA-C 11/20/2014,7:26 AM   Chart reviewed, patient examined, agree with above. He did not sleep well last night and then went into A-fib with RVR this am with a drop in BP to 80's. Has not felt as well today. He was started on IV amio and converted to sinus this afternoon. If he maintains sinus would switch to po in the am. Hold antihypertensives. Echo reviewed and I think he has a mild paravalvular leak, mean gradient of 8 mm Hg. He has no diastolic murmur on exam.

## 2014-11-20 NOTE — Progress Notes (Signed)
    Subjective:  Didn't sleep well last night and felt weak this morning. He is feeling better now. Family at bedside and notes that he had some confusion this morning.  Objective:  Vital Signs in the last 24 hours: Temp:  [98.3 F (36.8 C)-98.7 F (37.1 C)] 98.3 F (36.8 C) (07/07 0541) Pulse Rate:  [51-147] 51 (07/07 1616) Resp:  [18] 18 (07/07 1616) BP: (67-110)/(40-63) 92/43 mmHg (07/07 1616) SpO2:  [91 %-96 %] 96 % (07/07 1616) Weight:  [173 lb 15.1 oz (78.9 kg)] 173 lb 15.1 oz (78.9 kg) (07/07 0541)  Intake/Output from previous day: 07/06 0701 - 07/07 0700 In: 670.1 [P.O.:600; I.V.:70.1] Out: 295 [Urine:195; Chest Tube:100]  Physical Exam: Pt is alert and oriented, NAD HEENT: normal Neck: JVP - normal Lungs: CTA bilaterally CV: RRR with 2/6 systolic murmur at the apex, no diastolic murmur Abd: soft, NT, Positive BS, no hepatomegaly Ext: no C/C/E Skin: warm/dry no rash  Lab Results:  Recent Labs  11/18/14 1805 11/18/14 1807 11/19/14 0410  WBC 11.3*  --  13.0*  HGB 9.6* 10.2* 9.8*  PLT 114*  --  135*    Recent Labs  11/18/14 1807 11/19/14 0410  NA 139 137  K 4.6 4.3  CL 106 106  CO2  --  23  GLUCOSE 110* 112*  BUN 14 12  CREATININE 0.90 0.97   No results for input(s): TROPONINI in the last 72 hours.  Invalid input(s): CK, MB  Cardiac Studies: Postoperative day #1 echocardiogram: Study Conclusions  - Left ventricle: The cavity size was normal. Systolic function was normal. The estimated ejection fraction was in the range of 55% to 60%. Wall motion was normal; there were no regional wall motion abnormalities. Doppler parameters are consistent with abnormal left ventricular relaxation (grade 1 diastolic dysfunction). - Aortic valve: A TAVR bioprosthesis Edwards Sapien THV (size 29 mm, model # 9300TFX, serial # [id number]) was present. There was mild to moderate regurgitation. Valve area (VTI): 2.52 cm^2. Valve area (Vmax): 2.24  cm^2. Valve area (Vmean): 2.14 cm^2. - Aorta: Ascending aortic diameter: 42 mm (S). - Ascending aorta: The ascending aorta was mildly dilated. - Left atrium: The atrium was moderately dilated. - Tricuspid valve: There was moderate regurgitation. - Pulmonary arteries: Systolic pressure was mildly increased. PA peak pressure: 43 mm Hg (S).  Tele: Periods of atrial fibrillation with RVR, currently in sinus rhythm  Assessment/Plan:  1. Severe aortic stenosis postoperative day #2 after TAVR: Echocardiogram reviewed with normal gradients and mild to moderate paravalvular regurgitation. I do not appreciate a diastolic murmur and on my review his aortic insufficiency does not appear to be more than mild.  2. Hypotension: Minimal symptoms. He appears nontoxic. Has received albumin today. We'll give some normal saline and hold antihypertensives. Continue to follow.  3. Paroxysmal atrial fibrillation with RVR: Now on IV amiodarone. If he has recurrent atrial fibrillation may need to consider oral anticoagulation.  Otherwise as per Dr. Brynda Peon, M.D. 11/20/2014, 4:45 PM

## 2014-11-20 NOTE — Progress Notes (Signed)
CARDIAC REHAB PHASE I   Second attempt to see pt today. Spoke with pt RN, states pt is still receiving albumin and BP remains in the 99'I systolic. Will hold ambulation today and follow up tomorrow as appropriate.  Lenna Sciara, RN, BSN 11/20/2014 2:43 PM

## 2014-11-20 NOTE — Progress Notes (Signed)
2W08 Chase Bell, pt blood pressure has been unusually low, we have followed up with the provider and as a result have given 2 runs of albumin and initiated an amiodarone drip. Blood pressure is still low but stable, will continue to monitor and watch patient closely. MD is aware.

## 2014-11-21 ENCOUNTER — Encounter (HOSPITAL_COMMUNITY): Payer: Self-pay | Admitting: General Practice

## 2014-11-21 MED ORDER — AMIODARONE HCL 200 MG PO TABS
400.0000 mg | ORAL_TABLET | Freq: Two times a day (BID) | ORAL | Status: DC
  Administered 2014-11-21 – 2014-11-22 (×3): 400 mg via ORAL
  Filled 2014-11-21 (×4): qty 2

## 2014-11-21 MED ORDER — NICOTINE 7 MG/24HR TD PT24
7.0000 mg | MEDICATED_PATCH | TRANSDERMAL | Status: DC
  Administered 2014-11-21: 7 mg via TRANSDERMAL
  Filled 2014-11-21 (×2): qty 1

## 2014-11-21 NOTE — Care Management (Signed)
Important Message  Patient Details  Name: Chase Bell. MRN: [id number] Date of Birth: [birth year]   Medicare Important Message Given:  Yes-second notification given    Nathen May 11/21/2014, 10:00 AM

## 2014-11-21 NOTE — Progress Notes (Signed)
CARDIAC REHAB PHASE I   PRE:  Rate/Rhythm: 74 SR  BP:  Supine:   Sitting: 116/48  Standing:    SaO2: 95%RA  MODE:  Ambulation: 400 ft   POST:  Rate/Rhythm: 78 SR  BP:  Supine:   Sitting: 161/57  Standing:    SaO2: 98%RA 1200-1224 Pt ready to walk. Walked 400 ft on RA with rolling walker and minimal asst with steady gait. To recliner after walk. Sounds congested . Discussed with pt importance of using IS and coughing up. Stated it hurt. Asked staff to see if pt can have something so that he can bring up congestion. Tolerated well.    Graylon Good, RN BSN  11/21/2014 12:21 PM

## 2014-11-21 NOTE — Progress Notes (Signed)
UR Completed. Tierria Watson, RN, BSN.  [phone] 

## 2014-11-21 NOTE — Progress Notes (Signed)
      [address]       [place] [address]             [phone]      3 Days Post-Op Procedure(s) (LRB): TRANSCATHETER AORTIC VALVE REPLACEMENT, TRANSAORTIC (N/A) TRANSESOPHAGEAL ECHOCARDIOGRAM (TEE) (N/A) Subjective: Feels ok, moderate pulmonary congestion with cough. Only mild pain. Mild SOB  Objective: Vital signs in last 24 hours: Temp:  [98.1 F (36.7 C)] 98.1 F (36.7 C) (07/08 0323) Pulse Rate:  [41-111] 75 (07/08 0323) Cardiac Rhythm:  [-] Atrial fibrillation (07/07 2022) Resp:  [17-18] 18 (07/08 0323) BP: (67-118)/(40-72) 118/72 mmHg (07/08 0323) SpO2:  [93 %-96 %] 93 % (07/08 0323) Weight:  [179 lb (81.194 kg)] 179 lb (81.194 kg) (07/08 0327)  Hemodynamic parameters for last 24 hours:    Intake/Output from previous day: 07/07 0701 - 07/08 0700 In: 960 [P.O.:960] Out: 1875 [Urine:1875] Intake/Output this shift: Total I/O In: 240 [P.O.:240] Out: -   General appearance: alert, cooperative and no distress Heart: regular rate and rhythm Lungs: coarse with ronchi Abdomen: soft, non-tender Extremities: no edema Wound: incis healing well  Lab Results:  Recent Labs  11/18/14 1805 11/18/14 1807 11/19/14 0410  WBC 11.3*  --  13.0*  HGB 9.6* 10.2* 9.8*  HCT 30.1* 30.0* 31.1*  PLT 114*  --  135*   BMET:  Recent Labs  11/18/14 1807 11/19/14 0410  NA 139 137  K 4.6 4.3  CL 106 106  CO2  --  23  GLUCOSE 110* 112*  BUN 14 12  CREATININE 0.90 0.97  CALCIUM  --  8.0*    PT/INR:  Recent Labs  11/18/14 1213  LABPROT 16.6*  INR 1.33   ABG    Component Value Date/Time   PHART 7.336* 11/18/2014 1204   HCO3 22.6 11/18/2014 1204   TCO2 19 11/18/2014 1807   ACIDBASEDEF 3.0* 11/18/2014 1204   O2SAT 99.0 11/18/2014 1204   CBG (last 3)   Recent Labs  11/19/14 1116 11/20/14 1056 11/20/14 1615  GLUCAP 105* 113* 147*    Meds Scheduled Meds: . amiodarone  400 mg Oral BID  . aspirin EC  81 mg Oral Daily  .  carbidopa-levodopa  1 tablet Oral TID WC & HS  . clopidogrel  75 mg Oral Q breakfast  . docusate sodium  200 mg Oral Daily  . feeding supplement (ENSURE ENLIVE)  237 mL Oral TID WC  . hydroxychloroquine  200 mg Oral Daily  . pantoprazole  40 mg Oral Daily  . rosuvastatin  10 mg Oral q1800  . sodium chloride  3 mL Intravenous Q12H   Continuous Infusions:  PRN Meds:.sodium chloride, bisacodyl **OR** bisacodyl, hydrALAZINE, ondansetron **OR** ondansetron (ZOFRAN) IV, oxyCODONE, sodium chloride, traMADol  Xrays No results found.  Assessment/Plan: S/P Procedure(s) (LRB): TRANSCATHETER AORTIC VALVE REPLACEMENT, TRANSAORTIC (N/A) TRANSESOPHAGEAL ECHOCARDIOGRAM (TEE) (N/A)  1 doing well 2 wean O2 - cont pulm toilet and mobilize 3 still with intermit afib- cont amio, now po 4 sugars controlled, recheck labs in am, also magnesium and TSH  LOS: 3 days    Archer Vise E 11/21/2014

## 2014-11-21 NOTE — Progress Notes (Signed)
    Subjective:  Feeling okay this morning. States that he had a better night. No chest pain or shortness of breath. He does have a cough.  Objective:  Vital Signs in the last 24 hours: Temp:  [98.1 F (36.7 C)] 98.1 F (36.7 C) (07/08 0323) Pulse Rate:  [41-111] 75 (07/08 0323) Resp:  [17-18] 18 (07/08 0323) BP: (67-118)/(40-72) 118/72 mmHg (07/08 0323) SpO2:  [93 %-96 %] 93 % (07/08 0323) Weight:  [179 lb (81.194 kg)] 179 lb (81.194 kg) (07/08 0327)  Intake/Output from previous day: 07/07 0701 - 07/08 0700 In: 960 [P.O.:960] Out: 1875 [Urine:1875]  Physical Exam: Pt is alert and oriented, NAD HEENT: normal Neck: JVP - normal Lungs: Diffuse expiratory rhonchi, no rales appreciated CV: RRR grade 2/6 systolic murmur at the apex, no diastolic murmur Abd: soft, NT, Positive BS, no hepatomegaly Ext: no C/C/E Skin: warm/dry no rash   Lab Results:  Recent Labs  11/18/14 1805 11/18/14 1807 11/19/14 0410  WBC 11.3*  --  13.0*  HGB 9.6* 10.2* 9.8*  PLT 114*  --  135*    Recent Labs  11/18/14 1807 11/19/14 0410  NA 139 137  K 4.6 4.3  CL 106 106  CO2  --  23  GLUCOSE 110* 112*  BUN 14 12  CREATININE 0.90 0.97   No results for input(s): TROPONINI in the last 72 hours.  Invalid input(s): CK, MB  Tele: Normal sinus rhythm.  Assessment/Plan:  1. Severe aortic stenosis now postoperative day #3 after TAVR 2. Hypotension, improved 3. Paroxysmal atrial fibrillation, maintaining sinus rhythm on IV amiodarone  The patient appears stable. I'm not sure that his blood pressure measurements are accurate as he has bilateral subclavian artery stenosis. Will switch his IV amiodarone to oral dosing as he has maintained sinus rhythm now for 24 hours. Otherwise as per Dr. Cyndia Bent.   Sherren Mocha, M.D. 11/21/2014, 10:08 AM

## 2014-11-22 ENCOUNTER — Inpatient Hospital Stay (HOSPITAL_COMMUNITY): Payer: Medicare HMO

## 2014-11-22 LAB — BASIC METABOLIC PANEL
Anion gap: 7 (ref 5–15)
BUN: 18 mg/dL (ref 6–20)
CO2: 25 mmol/L (ref 22–32)
Calcium: 8.2 mg/dL — ABNORMAL LOW (ref 8.9–10.3)
Chloride: 101 mmol/L (ref 101–111)
Creatinine, Ser: 1.18 mg/dL (ref 0.61–1.24)
GFR, EST NON AFRICAN AMERICAN: 58 mL/min — AB (ref >60)
Glucose, Bld: 128 mg/dL — ABNORMAL HIGH (ref 65–99)
Potassium: 4.3 mmol/L (ref 3.5–5.1)
SODIUM: 133 mmol/L — AB (ref 135–145)

## 2014-11-22 LAB — TYPE AND SCREEN
ABO/RH(D): AB POS
Antibody Screen: NEGATIVE
Unit division: 0
Unit division: 0

## 2014-11-22 LAB — MAGNESIUM: Magnesium: 1.8 mg/dL (ref 1.7–2.4)

## 2014-11-22 LAB — CBC
HCT: 28.9 % — ABNORMAL LOW (ref 39.0–52.0)
Hemoglobin: 9.4 g/dL — ABNORMAL LOW (ref 13.0–17.0)
MCH: 27.5 pg (ref 26.0–34.0)
MCHC: 32.5 g/dL (ref 30.0–36.0)
MCV: 84.5 fL (ref 78.0–100.0)
PLATELETS: 146 10*3/uL — AB (ref 150–400)
RBC: 3.42 MIL/uL — ABNORMAL LOW (ref 4.22–5.81)
RDW: 16.8 % — AB (ref 11.5–15.5)
WBC: 7.7 10*3/uL (ref 4.0–10.5)

## 2014-11-22 LAB — TSH: TSH: 4.094 u[IU]/mL (ref 0.350–4.500)

## 2014-11-22 MED ORDER — AMIODARONE HCL 400 MG PO TABS: 400.0000 mg | ORAL_TABLET | Freq: Two times a day (BID) | ORAL | Status: AC

## 2014-11-22 MED ORDER — WARFARIN SODIUM 5 MG PO TABS: ORAL_TABLET | ORAL | Status: AC

## 2014-11-22 MED ORDER — NICOTINE 7 MG/24HR TD PT24: 7.0000 mg | MEDICATED_PATCH | TRANSDERMAL | Status: AC

## 2014-11-22 MED ORDER — OXYCODONE HCL 5 MG PO TABS: 5.0000 mg | ORAL_TABLET | ORAL | Status: AC | PRN

## 2014-11-22 MED ORDER — WARFARIN VIDEO: Freq: Once | Status: DC

## 2014-11-22 MED ORDER — WARFARIN SODIUM 2.5 MG PO TABS: ORAL_TABLET | ORAL | Status: DC

## 2014-11-22 MED ORDER — COUMADIN BOOK
Freq: Once | Status: DC
  Filled 2014-11-22: qty 1

## 2014-11-22 MED ORDER — WARFARIN SODIUM 7.5 MG PO TABS
7.5000 mg | ORAL_TABLET | Freq: Once | ORAL | Status: DC
  Filled 2014-11-22: qty 1

## 2014-11-22 MED ORDER — WARFARIN - PHARMACIST DOSING INPATIENT: Freq: Every day | Status: DC

## 2014-11-22 NOTE — Progress Notes (Signed)
    Subjective:  Feeling okay this morning. Appetite could be better.  Objective:  Vital Signs in the last 24 hours: Temp:  [98.5 F (36.9 C)-98.9 F (37.2 C)] 98.5 F (36.9 C) (07/09 0600) Pulse Rate:  [74-97] 97 (07/09 0600) Resp:  [18-20] 20 (07/09 0600) BP: (136-152)/(56-60) 152/56 mmHg (07/09 0600) SpO2:  [94 %-97 %] 94 % (07/09 0600) Weight:  [175 lb 4.8 oz (79.516 kg)] 175 lb 4.8 oz (79.516 kg) (07/09 0600)  Intake/Output from previous day: 07/08 0701 - 07/09 0700 In: 48 [P.O.:960] Out: 1300 [Urine:900; Stool:400]  Physical Exam: Pt is alert and oriented, NAD HEENT: normal Neck: JVP - normal Lungs: decreased breath sounds CV: irreg irreg grade 2/6 systolic murmur at the apex, no diastolic murmur Abd: soft, NT, Positive BS, no hepatomegaly Ext: no C/C/E Skin: warm/dry no rash   Lab Results:  Recent Labs  11/22/14 0249  WBC 7.7  HGB 9.4*  PLT 146*    Recent Labs  11/22/14 0249  NA 133*  K 4.3  CL 101  CO2 25  GLUCOSE 128*  BUN 18  CREATININE 1.18   No results for input(s): TROPONINI in the last 72 hours.  Invalid input(s): CK, MB  Tele: AF with CVR, PVCs  Assessment/Plan:  1. Severe aortic stenosis now postoperative day #4 after TAVR 2. Hypotension, improved 3. Paroxysmal atrial fibrillation, back in AF with CVR.  Plan: Discussed with Dr Burt Knack- stop Plavix, start Coumadin, no need for crossover. INR in [place] on Monday. TOC OV in [place] in 7-14 days.    Erlene Quan, PA 11/22/2014, 11:05 AM    Cardiology Attending  Patient seen and examined. Agree with above. [place] for discharge with coumadin re-initiation. Followup as above.   Mikle Bosworth.D.

## 2014-11-22 NOTE — Progress Notes (Signed)
Nursing note Patient given discharge instructions, medication list, and paper prescriptions. All questions were answered will discharge home as ordered. Yarissa Reining, Bettina Gavia RN

## 2014-11-22 NOTE — Discharge Summary (Signed)
[address]       [place] [address]             [phone]              Discharge Summary  Name: Chase SEAR Sr. DOB: 19-May-1934 80 y.o. MRN: [id number]   Admission Date: 11/18/2014 Discharge Date: 11/22/2014    Admitting Diagnosis: Active Problems:   Severe aortic stenosis    Discharge Diagnosis:  Active Problems:   Severe aortic stenosis Postoperative atrial fibrillation Expected postoperative blood loss anemia  Past Medical History  Diagnosis Date  . PVD (peripheral vascular disease)     with abdominal aortic aneurysm and claudication, s/p repair.  Marland Kitchen Hyperlipidemia   . Hypertension   . Valvular heart disease     moderate to severe aortic stenosis  . AAA (abdominal aortic aneurysm)     s/p repair/stent placement  . Heart murmur   . Carotid artery occlusion 2014  . Bilateral cataracts   . Tremor   . History of alcoholism   . History of seizures   . Anemia   . Osteoarthritis   . Rheumatoid arthritis   . Anemia   . Coronary artery disease   . Shortness of breath dyspnea     with exertion  . Seizures     40 years ago- heavy drinker- at that time  . Diabetes mellitus without complication     not on medication- any longer . "last A1C was 5 something"  . Personal history of kidney stones   . History of blood transfusion   . Gastric ulcer     hx of  . Basal cell carcinoma     Melonoma on right arm      Procedures: TRANSCATHETER AORTIC VALVE REPLACEMENT, TRANSAORTIC TRANSESOPHAGEAL ECHOCARDIOGRAM (TEE) on 11/18/2014    HPI:  The patient is a 80 y.o. male with a history of AODM, hyperlipidemia, remote smoking, rheumatoid arthritis on methotrexate for years and aortic stenosis. He underwent AAA repair with a stent graft approximately 6 years ago and then repair of a type 2 endoleak in 05/1013. He underwent right CEA in 09/2012. His most recent carotid ultrasound on 04/25/2014 showed a patent right internal carotid. There  was a 50-69% left internal carotid artery stenosis and there were elevated velocities consistent with stenosis of the right subclavian or innominate artery and the left subclavian artery. This has been followed by Dr. Lucky Cowboy in [place]. He reports an episode of near syncope last summer while repairing his lawn mower and then again in January 2016 after helping his wife cook a New Years meal. With both of these episodes he felt terrible and weak. He reports having progressive exertional shortness of breath and fatigue but usually only happens with heavy exertion. He can do his normal daily activities around the house without difficulty but his wife says that he sleeps a lot. He denies any chest pain, orthopnea, or PND. He recently underwent cardiac cath by Dr. Rockey Situ showing mild non-obstructive coronary disease. The aortic valve was not crossed. He was referred to Dr. Cyndia Bent for consideration of aortic valve replacement. It was felt that the risk for open surgical AVR is very high considering his diffuse, calcific vascular disease with an abdominal aortic stent graft, left gortex femoral popliteal bypass, moderate left carotid stenosis, stenosis of the innominate/right subclavian and left subclavian arteries, diabetes, rheumatoid arthritis on chronic methotrexate, and reduced mobility. A cardiac CT was performed that shows that  he would be an acceptable candidate for a Sapien 3 or XT valve, most likely a 29 mm. His abdominal and pelvic CTA show that he has significant aortoiliac disease with an aortobi-iliac stent graft and there continues to be slight progressive enlargement of the excluded aneurysm sac with evidence of potential type 2 endoleak. The aneurysm sac is now 6.3 x 5.5 cm compared to 6.0 x 5.1 cm in Jan 2015 when he had his coiling procedure for endoleak by Dr. Lucky Cowboy. It was thought that delivering a TAVR valve via the femoral arteries would be difficult and risky due to his stent graft, the extent of  disease and the left femoral to popliteal gortex graft. His ascending aorta appears adequate for a transaortic approach.  All risks, benefits and alternatives of surgery were explained in detail, and the patient agreed to proceed.    Hospital Course:  The patient was admitted to [facility] on 11/18/2014. The patient was taken to the operating room and underwent the above procedure.    The postoperative course was notable for atrial fibrillation which was treated with IV Amiodarone.  The patient converted to sinus rhythm and was switched to po Amiodarone, but did continue to have intermittent a-fib.  Cardiology saw the patient and he was started on Coumadin.  He was initially restarted on his home blood pressure medications, but became hypotensive, so his medications were held until his pressures stabilized.  He has since been restarted on low dose Hydralazine and is tolerating this well.  A postoperative echocardiogram was performed which showed a mild paravalvular leak, mean gradient of 8 mm Hg.    He has otherwise done well postop.  He has had a mild postop blood loss anemia which has not required transfusion. Incisions are healing well. He is ambulating in the halls without difficulty and is tolerating a regular diet.  The patient is medically stable on today's date for discharge home.    Recent vital signs:  Filed Vitals:   11/22/14 0600  BP: 152/56  Pulse: 97  Temp: 98.5 F (36.9 C)  Resp: 20    Recent laboratory studies:  CBC:  Recent Labs  11/22/14 0249  WBC 7.7  HGB 9.4*  HCT 28.9*  PLT 146*   BMET:   Recent Labs  11/22/14 0249  NA 133*  K 4.3  CL 101  CO2 25  GLUCOSE 128*  BUN 18  CREATININE 1.18  CALCIUM 8.2*    PT/INR: No results for input(s): LABPROT, INR in the last 72 hours.   Discharge Medications:     Medication List    STOP taking these medications        lisinopril 20 MG tablet  Commonly known as:  PRINIVIL,ZESTRIL      TAKE these medications         amiodarone 400 MG tablet  Commonly known as:  PACERONE  Take 1 tablet (400 mg total) by mouth 2 (two) times daily.     aspirin 81 MG tablet  Take 81 mg by mouth daily.     carbidopa-levodopa 25-100 MG per tablet  Commonly known as:  SINEMET IR  Take 1 tablet by mouth 4 (four) times daily.     cilostazol 100 MG tablet  Commonly known as:  PLETAL  Take 100 mg by mouth daily.     esomeprazole 20 MG capsule  Commonly known as:  NEXIUM  TAKE ONE CAPSULE BY MOUTH TWICE DAILY     ferrous fumarate 325 (106  FE) MG Tabs tablet  Commonly known as:  HEMOCYTE - 106 mg FE  Take 1 tablet by mouth daily.     folic acid 1 MG tablet  Commonly known as:  FOLVITE  Take 1 mg by mouth daily.     hydroxychloroquine 200 MG tablet  Commonly known as:  PLAQUENIL  Take 200 mg by mouth daily.     methotrexate 2.5 MG tablet  Commonly known as:  RHEUMATREX  Take 15 mg by mouth every 7 (seven) days.     nicotine 7 mg/24hr patch  Commonly known as:  NICODERM CQ - dosed in mg/24 hr  Place 1 patch (7 mg total) onto the skin daily.     oxyCODONE 5 MG immediate release tablet  Commonly known as:  Oxy IR/ROXICODONE  Take 1-2 tablets (5-10 mg total) by mouth every 3 (three) hours as needed for severe pain.     rosuvastatin 20 MG tablet  Commonly known as:  CRESTOR  Take 10 mg by mouth daily.     warfarin 5 MG tablet  Commonly known as:  COUMADIN  Take 5 mg daily or as directed by [facility]         Discharge Instructions:  The patient is to refrain from driving, heavy lifting or strenuous activity.  May shower daily and clean incisions with soap and water.  May resume regular diet.   Follow Up:   Follow-up Information    Follow up with Ida Rogue, MD.   Specialty:  Cardiology   Why:  office will contact you for INR check Monday and office visit in 7-14 days   Contact information:   [place] [address] [phone]        Shuree Brossart  H 11/22/2014, 11:23 AM

## 2014-11-22 NOTE — Discharge Instructions (Signed)
Information on my medicine - Coumadin®   (Warfarin) ° °This medication education was reviewed with me or my healthcare representative as part of my discharge preparation.  The pharmacist that spoke with me during my hospital stay was:  Alyia Lacerte Stillinger, RPH ° °Why was Coumadin prescribed for you? °Coumadin was prescribed for you because you have a blood clot or a medical condition that can cause an increased risk of forming blood clots. Blood clots can cause serious health problems by blocking the flow of blood to the heart, lung, or brain. Coumadin can prevent harmful blood clots from forming. °As a reminder your indication for Coumadin is:   Stroke Prevention Because Of Atrial Fibrillation ° °What test will check on my response to Coumadin? °While on Coumadin (warfarin) you will need to have an INR test regularly to ensure that your dose is keeping you in the desired range. The INR (international normalized ratio) number is calculated from the result of the laboratory test called prothrombin time (PT). ° °If an INR APPOINTMENT HAS NOT ALREADY BEEN MADE FOR YOU please schedule an appointment to have this lab work done by your health care provider within 7 days. °Your INR goal is usually a number between:  2 to 3 or your provider may give you a more narrow range like 2-2.5.  Ask your health care provider during an office visit what your goal INR is. ° °What  do you need to  know  About  COUMADIN? °Take Coumadin (warfarin) exactly as prescribed by your healthcare provider about the same time each day.  DO NOT stop taking without talking to the doctor who prescribed the medication.  Stopping without other blood clot prevention medication to take the place of Coumadin may increase your risk of developing a new clot or stroke.  Get refills before you run out. ° °What do you do if you miss a dose? °If you miss a dose, take it as soon as you remember on the same day then continue your regularly scheduled  regimen the next day.  Do not take two doses of Coumadin at the same time. ° °Important Safety Information °A possible side effect of Coumadin (Warfarin) is an increased risk of bleeding. You should call your healthcare provider right away if you experience any of the following: °  Bleeding from an injury or your nose that does not stop. °  Unusual colored urine (red or dark brown) or unusual colored stools (red or black). °  Unusual bruising for unknown reasons. °  A serious fall or if you hit your head (even if there is no bleeding). ° °Some foods or medicines interact with Coumadin® (warfarin) and might alter your response to warfarin. To help avoid this: °  Eat a balanced diet, maintaining a consistent amount of Vitamin K. °  Notify your provider about major diet changes you plan to make. °  Avoid alcohol or limit your intake to 1 drink for women and 2 drinks for men per day. °(1 drink is 5 oz. wine, 12 oz. beer, or 1.5 oz. liquor.) ° °Make sure that ANY health care provider who prescribes medication for you knows that you are taking Coumadin (warfarin).  Also make sure the healthcare provider who is monitoring your Coumadin knows when you have started a new medication including herbals and non-prescription products. ° °Coumadin® (Warfarin)  Major Drug Interactions  °Increased Warfarin Effect Decreased Warfarin Effect  °Alcohol (large quantities) °Antibiotics (esp. Septra/Bactrim, Flagyl, Cipro) °Amiodarone (Cordarone) °Aspirin (  ASA) °Cimetidine (Tagamet) °Megestrol (Megace) °NSAIDs (ibuprofen, naproxen, etc.) °Piroxicam (Feldene) °Propafenone (Rythmol SR) °Propranolol (Inderal) °Isoniazid (INH) °Posaconazole (Noxafil) Barbiturates (Phenobarbital) °Carbamazepine (Tegretol) °Chlordiazepoxide (Librium) °Cholestyramine (Questran) °Griseofulvin °Oral Contraceptives °Rifampin °Sucralfate (Carafate) °Vitamin K  ° °Coumadin® (Warfarin) Major Herbal Interactions  °Increased Warfarin Effect Decreased Warfarin Effect    °Garlic °Ginseng °Ginkgo biloba Coenzyme Q10 °Green tea °St. John’s wort   ° °Coumadin® (Warfarin) FOOD Interactions  °Eat a consistent number of servings per week of foods HIGH in Vitamin K °(1 serving = ½ cup)  °Collards (cooked, or boiled & drained) °Kale (cooked, or boiled & drained) °Mustard greens (cooked, or boiled & drained) °Parsley *serving size only = ¼ cup °Spinach (cooked, or boiled & drained) °Swiss chard (cooked, or boiled & drained) °Turnip greens (cooked, or boiled & drained)  °Eat a consistent number of servings per week of foods MEDIUM-HIGH in Vitamin K °(1 serving = 1 cup)  °Asparagus (cooked, or boiled & drained) °Broccoli (cooked, boiled & drained, or raw & chopped) °Brussel sprouts (cooked, or boiled & drained) *serving size only = ½ cup °Lettuce, raw (green leaf, endive, romaine) °Spinach, raw °Turnip greens, raw & chopped  ° °These websites have more information on Coumadin (warfarin):  www.coumadin.com; °www.ahrq.gov/consumer/coumadin.htm; ° ° ° °

## 2014-11-22 NOTE — Progress Notes (Signed)
CARDIAC REHAB PHASE I   PRE:  Rate/Rhythm: Afib 93 occasional PVC  BP:  Supine: 128/47       SaO2: 98  MODE:  Ambulation: 420 ft   POST:  Rate/Rhythem: 98  BP:    Sitting: 151/56     SaO2: 99% Room Air  1105-1145  Patient ambulated in the hallway using a rolling walker. Patient denied shortness of breath. Tolerated well. Patient remains congested. Encouraged Chase Bell to continue to use his incentive spirometer at home. Exercise instructions and heart healthy diet reviewed. Chase Bell is not interested in phase 2 outpatient cardiac rehab at this time.   Chase Bell, [name] RN BSN

## 2014-11-22 NOTE — Progress Notes (Addendum)
       [address]       [place] [address]             [phone]          4 Days Post-Op Procedure(s) (LRB): TRANSCATHETER AORTIC VALVE REPLACEMENT, TRANSAORTIC (N/A) TRANSESOPHAGEAL ECHOCARDIOGRAM (TEE) (N/A)  Subjective: Stable night, no complaints.   Objective: Vital signs in last 24 hours: Patient Vitals for the past 24 hrs:  BP Temp Temp src Pulse Resp SpO2 Height Weight  11/22/14 0600 (!) 152/56 mmHg 98.5 F (36.9 C) Oral 97 20 94 % 5\' 9"  (1.753 m) 175 lb 4.8 oz (79.516 kg)  11/21/14 2053 140/60 mmHg 98.9 F (37.2 C) Oral 74 18 96 % - -  11/21/14 1550 (!) 136/56 mmHg - - 76 20 97 % - -   Current Weight  11/22/14 175 lb 4.8 oz (79.516 kg)     Intake/Output from previous day: 07/08 0701 - 07/09 0700 In: 960 [P.O.:960] Out: 1300 [Urine:900; Stool:400]    PHYSICAL EXAM:  Heart: RRR, occ PACs Lungs: Clear Wound: Clean and dry Extremities: No edema    Lab Results: CBC: Recent Labs  11/22/14 0249  WBC 7.7  HGB 9.4*  HCT 28.9*  PLT 146*   BMET:  Recent Labs  11/22/14 0249  NA 133*  K 4.3  CL 101  CO2 25  GLUCOSE 128*  BUN 18  CREATININE 1.18  CALCIUM 8.2*    PT/INR: No results for input(s): LABPROT, INR in the last 72 hours.  Mg 1.8 TSH 4.094   Assessment/Plan: S/P Procedure(s) (LRB): TRANSCATHETER AORTIC VALVE REPLACEMENT, TRANSAORTIC (N/A) TRANSESOPHAGEAL ECHOCARDIOGRAM (TEE) (N/A) AF, now mostly maintaining SR on po Amio.  BPs appear elevated, but pt has bilateral subclavian stenosis. Continue hydralazine. Hopefully home soon if he remains stable.   LOS: 4 days    COLLINS,GINA H 11/22/2014  Cleared for d/c by cardiology, to go home on coumadin as managed by Cardiology Chest xray today if clear plan d/c today I have seen and examined Maddock M Stiefel Sr. and agree with the above assessment  and plan.  Grace Isaac MD Beeper [phone] Office [phone] 11/22/2014 11:54 AM

## 2014-11-22 NOTE — Progress Notes (Signed)
ANTICOAGULATION CONSULT NOTE - Initial Consult  Pharmacy Consult for Coumadin Indication: atrial fibrillation  Allergies  Allergen Reactions  . Simvastatin     Myalgia     Patient Measurements: Height: 5\' 9"  (175.3 cm) Weight: 175 lb 4.8 oz (79.516 kg) IBW/kg (Calculated) : 70.7 Heparin Dosing Weight:    Vital Signs: Temp: 98.5 F (36.9 C) (07/09 0600) Temp Source: Oral (07/09 0600) BP: 152/56 mmHg (07/09 0600) Pulse Rate: 97 (07/09 0600)  Labs:  Recent Labs  11/22/14 0249  HGB 9.4*  HCT 28.9*  PLT 146*  CREATININE 1.18    Estimated Creatinine Clearance: 52.4 mL/min (by C-G formula based on Cr of 1.18).   Medical History: Past Medical History  Diagnosis Date  . PVD (peripheral vascular disease)     with abdominal aortic aneurysm and claudication, s/p repair.  Marland Kitchen Hyperlipidemia   . Hypertension   . Valvular heart disease     moderate to severe aortic stenosis  . AAA (abdominal aortic aneurysm)     s/p repair/stent placement  . Heart murmur   . Carotid artery occlusion 2014  . Bilateral cataracts   . Tremor   . History of alcoholism   . History of seizures   . Anemia   . Osteoarthritis   . Rheumatoid arthritis   . Anemia   . Coronary artery disease   . Shortness of breath dyspnea     with exertion  . Seizures     40 years ago- heavy drinker- at that time  . Diabetes mellitus without complication     not on medication- any longer . "last A1C was 5 something"  . Personal history of kidney stones   . History of blood transfusion   . Gastric ulcer     hx of  . Basal cell carcinoma     Melonoma on right arm    Medications:  Prescriptions prior to admission  Medication Sig Dispense Refill Last Dose  . aspirin 81 MG tablet Take 81 mg by mouth daily.   11/17/2014 at Unknown time  . carbidopa-levodopa (SINEMET IR) 25-100 MG per tablet Take 1 tablet by mouth 4 (four) times daily.    11/18/2014 at 0315  . cilostazol (PLETAL) 100 MG tablet Take 100 mg by  mouth daily.    11/17/2014 at Unknown time  . esomeprazole (NEXIUM) 20 MG capsule TAKE ONE CAPSULE BY MOUTH TWICE DAILY   11/18/2014 at Unknown time  . ferrous fumarate (HEMOCYTE - 106 MG FE) 325 (106 FE) MG TABS tablet Take 1 tablet by mouth daily.   Past Week at Unknown time  . folic acid (FOLVITE) 1 MG tablet Take 1 mg by mouth daily.   11/17/2014 at Unknown time  . hydroxychloroquine (PLAQUENIL) 200 MG tablet Take 200 mg by mouth daily.    11/17/2014 at Unknown time  . lisinopril (PRINIVIL,ZESTRIL) 20 MG tablet Take 20 mg by mouth daily.   11/17/2014 at Unknown time  . methotrexate (RHEUMATREX) 2.5 MG tablet Take 15 mg by mouth every 7 (seven) days.   Past Month at Unknown time  . rosuvastatin (CRESTOR) 20 MG tablet Take 10 mg by mouth daily.    11/17/2014 at Unknown time    Assessment: 80 yo M presents on 7/5 with aortic stenosis and for an eval for TAVR.  Anticoagulation: s/p TAVR. Baseline INR 1.33. Start Coumadin for new Afib. Monitor for interaction (rapid elevated of INR) with amiodarone.  Goal of Therapy:  INR 2-3 Monitor platelets by anticoagulation protocol:  Yes   Plan:  Coumadin 7.5mg  po x 1, then home on 5mg  daily Daily INR in hospital   Shinika Estelle S. Alford Highland, PharmD, BCPS Clinical Staff Pharmacist Pager [phone]  Eilene Ghazi Stillinger 11/22/2014,11:18 AM

## 2014-11-23 LAB — ABO/RH: ABO/RH(D): AB POS

## 2014-11-24 ENCOUNTER — Encounter: Payer: Self-pay | Admitting: Cardiovascular Disease

## 2014-11-24 ENCOUNTER — Ambulatory Visit (INDEPENDENT_AMBULATORY_CARE_PROVIDER_SITE_OTHER): Payer: Medicare HMO | Admitting: Cardiovascular Disease

## 2014-11-24 VITALS — BP 130/68 | HR 68 | Ht 69.0 in | Wt 176.8 lb

## 2014-11-24 DIAGNOSIS — I480 Paroxysmal atrial fibrillation: Secondary | ICD-10-CM

## 2014-11-24 DIAGNOSIS — I159 Secondary hypertension, unspecified: Secondary | ICD-10-CM | POA: Diagnosis not present

## 2014-11-24 DIAGNOSIS — I350 Nonrheumatic aortic (valve) stenosis: Secondary | ICD-10-CM | POA: Diagnosis not present

## 2014-11-24 DIAGNOSIS — E1159 Type 2 diabetes mellitus with other circulatory complications: Secondary | ICD-10-CM

## 2014-11-24 DIAGNOSIS — E1151 Type 2 diabetes mellitus with diabetic peripheral angiopathy without gangrene: Secondary | ICD-10-CM

## 2014-11-24 DIAGNOSIS — I4891 Unspecified atrial fibrillation: Secondary | ICD-10-CM | POA: Insufficient documentation

## 2014-11-24 DIAGNOSIS — Z954 Presence of other heart-valve replacement: Secondary | ICD-10-CM | POA: Diagnosis not present

## 2014-11-24 DIAGNOSIS — Z952 Presence of prosthetic heart valve: Secondary | ICD-10-CM | POA: Insufficient documentation

## 2014-11-24 NOTE — Patient Instructions (Signed)
You are doing well. No medication changes were made.  Please stay on warfarin 5 mg daily INR check in [place] on Friday  Coumadin clinic next Wednesday (unless you are able to get in with [facility])  Please call us if you have new issues that need to be addressed before your next appt.  Your physician wants you to follow-up in: 6 months.  You will receive a reminder letter in the mail two months in advance. If you don't receive a letter, please call our office to schedule the follow-up appointment.

## 2014-11-24 NOTE — Assessment & Plan Note (Signed)
Blood pressure is well controlled on today's visit. No changes made to the medications. 

## 2014-11-24 NOTE — Assessment & Plan Note (Signed)
Doing well following surgery last week. He has follow-up with CT surgery next week. No medication changes made

## 2014-11-24 NOTE — Assessment & Plan Note (Signed)
Postop atrial fibrillation, converting to normal sinus rhythm on amiodarone. Recommended he stay on amiodarone 200 mg twice a day for now. Can likely decreased down to 200 mg daily as he proceeds through his rehabilitation. Currently on warfarin. We will schedule him to be seen through our anticoagulation clinic. Suggested he call his primary care physician to see if he can have this managed through them to avoid a co-pay

## 2014-11-24 NOTE — Assessment & Plan Note (Signed)
We have encouraged careful diet management  Most recent hemoglobin A1c well controlled at 5.9

## 2014-11-24 NOTE — Progress Notes (Signed)
Patient ID: Unique Searfoss., male    DOB: 1934-12-02, 80 y.o.   MRN: [id number]  HPI Comments: [name] is a pleasant 80 year old gentleman with significant peripheral vascular disease, abdominal aortic aneurysm with endograft placement, left femoral bypass, carotid endarterectomy on the right, moderate disease of the left carotid, diabetes2 with complications, history of GI bleed May 2008 with positive H. pylori, severe aortic valve stenosis , s/p AVR 11/18/2014, hyperlipidemia,  Notes indicate renal artery stenosis, osteoarthritis, rheumatoid arthritis, on methotrexate. Prior history of alcohol, seizures, essential tremor. Baseline anemia Prior smoking history for 50 years, quit in 2006 Episode of near syncope in the summer of 2015 and again 05/16/2014 His vascular disease is followed by Dr. Lucky Cowboy. He has decreased pulse in his right upper extremity He presents today for follow-up after recent AVR  Surgery with Dr. Burt Knack and Dr. Cyndia Bent He reports doing well through the surgical postop period, some postoperative atrial fibrillation but converting to normal sinus rhythm on amiodarone. Started on warfarin 11/22/2014, now 3 days. INR today 1.1 Some low-grade cough possibly getting better. Doing more, walking better, getting stronger daily. Surgical site right of the mediastinum is doing well, draining suture still intact. No discharge noted. Overall he feels well and is in good spirits. As indicated that he would like to have his Coumadin checked in [place] avoid frequent trips to [place].  EKG on today's visit shows normal sinus rhythm with rate 68 bpm, no significant ST or T-wave changes  Other past medical history over the summer 2015 he had an episode where he was working outside, felt acutely hot, dizzy, almost passed out. Symptoms resolved once he got back into the house and rested. Symptoms were similar to 05/16/2014 when he developed lightheadedness. He walked to the  bathroom with worsening symptoms, felt sweaty, had pain across his shoulder blades. Wife reports he was very pale, he had to lay down on the ground, had incontinence. On the ground his blood pressure was 70 systolic, heart rates in the 50-60 range, Glucose in the 140s  echocardiogram showed progression of his aortic valve stenosis, now severe, peak gradient 79 mmHg, mean gradient 45. There is been moderate progression since last echocardiogram April 2014   echocardiogram 09/11/2012 showing normal ejection fraction, mitral valve regurgitation detailed as moderate to severe, also moderate to severe aortic valve stenosis, moderate TR. Review of the aortic valve parameters suggest moderate aortic valve stenosis. Maximum velocity 3.6 m/s, peak gradient 52 mm mercury, mean gradient 31 mm of mercury  Stress test 09/11/2012 shows no ischemia, normal ejection fraction. He did a Bruce protocol and achieved peak heart rate 102 beats per minute. This is actually a suboptimal stress test as target heart rate was not achieved. This was not mentioned in the final conclusions   history ofoil embolization for endoleak January 2015. This was done after sac growth was noted, type II endoleak identified. Surgeon was Dr. Lucky Cowboy Notes indicate recent hemoglobin A1c 6.0. Blood pressure has been well-controlled on his current medications. cholesterol less than 150. Most recent lab work shows total cholesterol 135, LDL 80, HDL 40       Allergies  Allergen Reactions  . Simvastatin     Myalgia     Outpatient Encounter Prescriptions as of 11/24/2014  Medication Sig  . amiodarone (PACERONE) 400 MG tablet Take 1 tablet (400 mg total) by mouth 2 (two) times daily.  Marland Kitchen aspirin 81 MG tablet Take 81 mg by mouth daily.  . carbidopa-levodopa (  SINEMET IR) 25-100 MG per tablet Take 1 tablet by mouth 4 (four) times daily.   . cilostazol (PLETAL) 100 MG tablet Take 100 mg by mouth daily.   Marland Kitchen esomeprazole (NEXIUM) 20 MG capsule  TAKE ONE CAPSULE BY MOUTH TWICE DAILY  . ferrous fumarate (HEMOCYTE - 106 MG FE) 325 (106 FE) MG TABS tablet Take 1 tablet by mouth daily.  . folic acid (FOLVITE) 1 MG tablet Take 1 mg by mouth daily.  . hydroxychloroquine (PLAQUENIL) 200 MG tablet Take 200 mg by mouth daily.   . methotrexate (RHEUMATREX) 2.5 MG tablet Take 15 mg by mouth every 7 (seven) days.  . nicotine (NICODERM CQ - DOSED IN MG/24 HR) 7 mg/24hr patch Place 1 patch (7 mg total) onto the skin daily.  Marland Kitchen oxyCODONE (OXY IR/ROXICODONE) 5 MG immediate release tablet Take 1-2 tablets (5-10 mg total) by mouth every 3 (three) hours as needed for severe pain.  . rosuvastatin (CRESTOR) 20 MG tablet Take 10 mg by mouth daily.   Marland Kitchen warfarin (COUMADIN) 5 MG tablet Take 5 mg daily or as directed by Coumadin Clinic   No facility-administered encounter medications on file as of 11/24/2014.    Past Medical History  Diagnosis Date  . PVD (peripheral vascular disease)     with abdominal aortic aneurysm and claudication, s/p repair.  Marland Kitchen Hyperlipidemia   . Hypertension   . Valvular heart disease     moderate to severe aortic stenosis  . AAA (abdominal aortic aneurysm)     s/p repair/stent placement  . Heart murmur   . Carotid artery occlusion 2014  . Bilateral cataracts   . Tremor   . History of alcoholism   . History of seizures   . Anemia   . Osteoarthritis   . Rheumatoid arthritis   . Anemia   . Coronary artery disease   . Shortness of breath dyspnea     with exertion  . Seizures     40 years ago- heavy drinker- at that time  . Diabetes mellitus without complication     not on medication- any longer . "last A1C was 5 something"  . Personal history of kidney stones   . History of blood transfusion   . Gastric ulcer     hx of  . Basal cell carcinoma     Melonoma on right arm    Past Surgical History  Procedure Laterality Date  . Femoral bypass      left leg  . Cataract extraction, bilateral Bilateral   . Hernia repair  Bilateral 1996  . Abdominal aortic aneurysm repair  08/29/2007    stent graft repair  . Cystoscopy    . Carotid endarterectomy  2014    right   . Abdominal aortic aneurysm repair    . Cardiac catheterization N/A 09/18/2014    Procedure: Left Heart Cath;  Surgeon: Minna Merritts, MD;  Location: [facility];  Service: Cardiovascular;  Laterality: N/A;  . Colonoscopy    . Transcatheter aortic valve replacement, transaortic N/A 11/18/2014    Procedure: TRANSCATHETER AORTIC VALVE REPLACEMENT, TRANSAORTIC;  Surgeon: Gaye Pollack, MD;  Location: [facility];  Service: Open Heart Surgery;  Laterality: N/A;  . Tee without cardioversion N/A 11/18/2014    Procedure: TRANSESOPHAGEAL ECHOCARDIOGRAM (TEE);  Surgeon: Gaye Pollack, MD;  Location: [facility];  Service: Open Heart Surgery;  Laterality: N/A;    Social History  reports that he quit smoking about 11 years ago. His smoking use  included Cigarettes. He has a 50 pack-year smoking history. He has quit using smokeless tobacco. His smokeless tobacco use included Chew. He reports that he does not drink alcohol or use illicit drugs.  Family History family history includes Heart disease in his brother.  Review of Systems  Constitutional: Positive for fatigue.  Respiratory: Positive for cough.   Cardiovascular: Negative.   Gastrointestinal: Negative.   Musculoskeletal: Positive for arthralgias.  Neurological: Negative.   Hematological: Negative.   Psychiatric/Behavioral: Negative.   All other systems reviewed and are negative.   BP 130/68 mmHg  Pulse 68  Ht 5\' 9"  (1.753 m)  Wt 176 lb 12 oz (80.173 kg)  BMI 26.09 kg/m2  Physical Exam  Constitutional: He is oriented to person, place, and time. He appears well-developed and well-nourished.  HENT:  Head: Normocephalic.  Nose: Nose normal.  Mouth/Throat: Oropharynx is clear and moist.  Eyes: Conjunctivae are normal. Pupils are equal, round, and reactive to light.  Neck: Normal range of  motion. Neck supple. No JVD present.  Cardiovascular: Normal rate, regular rhythm, S1 normal, S2 normal and intact distal pulses.  Exam reveals no gallop and no friction rub.   Murmur heard.  Systolic murmur is present with a grade of 1/6  Pulmonary/Chest: Effort normal and breath sounds normal. No respiratory distress. He has no wheezes. He has no rales. He exhibits no tenderness.  Abdominal: Soft. Bowel sounds are normal. He exhibits no distension. There is no tenderness.  Musculoskeletal: Normal range of motion. He exhibits no edema or tenderness.  Lymphadenopathy:    He has no cervical adenopathy.  Neurological: He is alert and oriented to person, place, and time. Coordination normal.  Skin: Skin is warm and dry. No rash noted. No erythema.  Psychiatric: He has a normal mood and affect. His behavior is normal. Judgment and thought content normal.      Assessment and Plan   Nursing note and vitals reviewed.

## 2014-11-28 ENCOUNTER — Telehealth: Payer: Self-pay | Admitting: *Deleted

## 2014-12-02 ENCOUNTER — Ambulatory Visit: Payer: Medicare HMO | Admitting: Surgery

## 2014-12-08 ENCOUNTER — Ambulatory Visit: Payer: Medicare HMO

## 2014-12-15 DEATH — deceased

## 2014-12-17 ENCOUNTER — Other Ambulatory Visit (HOSPITAL_COMMUNITY): Payer: Medicare HMO

## 2014-12-17 ENCOUNTER — Ambulatory Visit: Payer: Medicare HMO | Admitting: Surgery
